# Patient Record
Sex: Female | Born: 1995 | Race: Black or African American | Hispanic: No | Marital: Single | Smoking: Never smoker
Health system: Southern US, Community
[De-identification: ages and names within clinical notes are randomized; demographics above are authoritative.]

## PROBLEM LIST (undated history)

## (undated) DIAGNOSIS — J45909 Unspecified asthma, uncomplicated: Secondary | ICD-10-CM

## (undated) DIAGNOSIS — D649 Anemia, unspecified: Secondary | ICD-10-CM

## (undated) HISTORY — DX: Anemia, unspecified: D64.9

## (undated) HISTORY — DX: Unspecified asthma, uncomplicated: J45.909

---
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
## 2016-05-06 ENCOUNTER — Encounter (HOSPITAL_COMMUNITY): Payer: Self-pay | Admitting: *Deleted

## 2016-05-06 ENCOUNTER — Emergency Department (HOSPITAL_COMMUNITY)
Admission: EM | Admit: 2016-05-06 | Discharge: 2016-05-07 | Disposition: A | Discharge status: Home / Self Care | Payer: 59 | Attending: Emergency Medicine | Admitting: Emergency Medicine

## 2016-05-06 DIAGNOSIS — R0602 Shortness of breath: Secondary | ICD-10-CM

## 2016-05-06 DIAGNOSIS — Z79899 Other long term (current) drug therapy: Secondary | ICD-10-CM | POA: Insufficient documentation

## 2016-05-06 NOTE — ED Provider Notes (Signed)
[facility]-EMERGENCY DEPT Provider Note   CSN: [id number] Arrival date & time: 05/06/16  1946  By signing my name below, I, Melissa Burgess, attest that this documentation has been prepared under the direction and in the presence of [employer], PA-C. Electronically Signed: Linna Burgess, Scribe. 05/06/2016. 11:24 PM.  History   Chief Complaint Chief Complaint  Patient presents with  . Shortness of Breath    The history is provided by the patient. No language interpreter was used.     HPI Comments: Melissa Burgess is a 21 y.o. female with PMHx of asthma who presents to the Emergency Department complaining of persistent dyspnea on exertion beginning yesterday. Patient reports she has been short of breath with light ambulation and talking which is unusual for her. She reports associated chest tightness, upper chest pain, palpitations, and occasional tingling in her extremities and around her mouth when her palpitations are present. She states her palpitations are most significant upon waking. Patient notes her symptoms are minimally modified by PO intake. No excessive caffeine or OTC cold medication use. [facility] the same and had a CXR, EKG, D-dimer, thyroid test, and basic labs performed, all of which were normal. She denies fevers, cough, congestion, wheezing, or any other associated symptoms. No recent URI type symptoms.   History reviewed. No pertinent past medical history.  There are no active problems to display for this patient.   History reviewed. No pertinent surgical history.  OB History    No data available       Home Medications    Prior to Admission medications   Medication Sig Start Date End Date Taking? Authorizing Provider  BIOTIN PO Take 1 tablet by mouth daily.   Yes Historical Provider, MD  ibuprofen (ADVIL,MOTRIN) 200 MG tablet Take 400 mg by mouth every 6 (six) hours as needed for cramping.   Yes Historical Provider, MD      Family History No family history on file.  Social History Social History  Substance Use Topics  . Smoking status: Never Smoker  . Smokeless tobacco: Never Used  . Alcohol use No     Allergies   Patient has no known allergies.   Review of Systems Review of Systems  Constitutional: Negative for fever.  HENT: Negative for congestion, rhinorrhea and sore throat.   Eyes: Negative for redness.  Respiratory: Positive for chest tightness and shortness of breath. Negative for cough and wheezing.   Cardiovascular: Positive for chest pain and palpitations.  Gastrointestinal: Negative for abdominal pain, diarrhea, nausea and vomiting.  Genitourinary: Negative for dysuria.  Musculoskeletal: Negative for myalgias.  Skin: Negative for rash.  Neurological: Positive for numbness (tingling). Negative for headaches.   Physical Exam Updated Vital Signs BP 101/60 (BP Location: Right Arm)   Pulse 75   Temp 98 F (36.7 C) (Oral)   Resp 16   LMP 04/27/2016   SpO2 96%   Physical Exam  Constitutional: She is oriented to person, place, and time. She appears well-developed and well-nourished. No distress.  HENT:  Head: Normocephalic and atraumatic.  Mouth/Throat: Oropharynx is clear and moist.  Eyes: Conjunctivae and EOM are normal. Right eye exhibits no discharge. Left eye exhibits no discharge.  Neck: Normal range of motion. Neck supple. No tracheal deviation present.  Cardiovascular: Normal rate, regular rhythm and normal heart sounds.  Exam reveals no gallop and no friction rub.   No murmur heard. Pulmonary/Chest: Effort normal and breath sounds normal. No respiratory distress.  Abdominal: Soft. There is no tenderness.  Musculoskeletal: Normal range of motion.  Neurological: She is alert and oriented to person, place, and time.  Skin: Skin is warm and dry.  Psychiatric: She has a normal mood and affect. Her behavior is normal.  Nursing note and vitals reviewed.  ED Treatments /  Results   EKG  EKG Interpretation  Date/Time:  Wednesday May 06 2016 23:40:38 EDT Ventricular Rate:  69 PR Interval:    QRS Duration: 94 QT Interval:  390 QTC Calculation: 418 R Axis:   81 Text Interpretation:  Sinus rhythm Normal ECG No previous ECGs available Confirmed by MOLPUS  MD, Jonny Ruiz ([id number]) on 05/06/2016 11:51:20 PM       Procedures Procedures (including critical care time)  DIAGNOSTIC STUDIES: Oxygen Saturation is 96% on RA, adequate by my interpretation.    COORDINATION OF CARE: 11:32 PM Discussed treatment plan with pt at bedside and pt agreed to plan.  Medications Ordered in ED Medications - No data to display   Initial Impression / Assessment and Plan / ED Course  I have reviewed the triage vital signs and the nursing notes.  Pertinent labs & imaging results that were available during my care of the patient were reviewed by me and considered in my medical decision making (see chart for details).     Vital signs reviewed and are as follows: Vitals:   05/06/16 1951 05/06/16 2310  BP: (!) 104/59 101/60  Pulse: 80 75  Resp: 20 16  Temp: 97.5 F (36.4 C) 98 F (36.7 C)   Patient was ambulated and maintained normal pulse ox and heart rate. Updated on EKG results.  Feel next step is for patient to visit with a cardiologist for consideration of Holter monitor and echocardiography.  Patient advised to return with any worsening symptoms including severe chest pain, difficulty breathing, syncope, new symptoms or other concerns. Discussed with mother at bedside. They're in agreement with plan. Discharged home with referral.  Final Clinical Impressions(s) / ED Diagnoses   Final diagnoses:  Shortness of breath   Patient with 2 days of shortness of breath and palpitations. Prior to arrival, patient had reportedly negative chest x-ray, d-dimer, general labs, thyroid test. Here EKG repeated and shows no signs of prolonged QTC, WPW, Brugada syndrome. EKG is  normal. She has ambulated without desaturation or tachycardia. At this point, do not feel that any further workup is required emergently. Patient and mother educated on signs and symptoms return and they seem reliable to return if any of these occur. Encouraged follow-up with cardiology to see a full-term monitor is indicated to rule out intermittent tachyarrhythmia such as SVT. Patient may try over-the-counter PPI as desired for empiric therapy.  New Prescriptions New Prescriptions   No medications on file   I personally performed the services described in this documentation, which was scribed in my presence. The recorded information has been reviewed and is accurate.    Renne Crigler, PA-C 05/07/16 6045    Paula Libra, MD 05/07/16 [phone]

## 2016-05-06 NOTE — ED Notes (Signed)
Patient ambulated to restroom and around ED with no difficulties. Pt's oxygen saturation never dropped below 96% on room air. Pt tolerated the ambulation well.

## 2016-05-06 NOTE — ED Triage Notes (Addendum)
Pt complains of shortness of breath since yesterday. Pt states she has a hx of asthma.  [facility], had d-dimer and chest x-ray, which were normal.

## 2016-05-07 NOTE — Discharge Instructions (Signed)
Please read and follow all provided instructions.  Your diagnoses today include:  1. Shortness of breath     Tests performed today include:  An EKG of your heart  Vital signs. See below for your results today.   Medications prescribed:   None  Take any prescribed medications only as directed.  Follow-up instructions: Please follow-up with your primary care provider as soon or the cardiologist listed as you can for further evaluation of your symptoms.   Return instructions:  SEEK IMMEDIATE MEDICAL ATTENTION IF:  You have severe chest pain, especially if the pain is crushing or pressure-like and spreads to the arms, back, neck, or jaw, or if you have sweating, nausea (feeling sick to your stomach), or shortness of breath. THIS IS AN EMERGENCY. Don't wait to see if the pain will go away. Get medical help at once. Call 911 or 0 (operator). DO NOT drive yourself to the hospital.   Your chest pain gets worse and does not go away with rest.   You have an attack of chest pain lasting longer than usual, despite rest and treatment with the medications your caregiver has prescribed.   You wake from sleep with chest pain or severe shortness of breath.  You pass out or faint.  You have chest pain not typical of your usual pain for which you originally saw your caregiver.   You have any other emergent concerns regarding your health.  Additional Information: Chest pain comes from many different causes. Your caregiver has diagnosed you as having chest pain that is not specific for one problem, but does not require admission.  You are at low risk for an acute heart condition or other serious illness.   Your vital signs today were: BP 101/60 (BP Location: Right Arm)    Pulse 75    Temp 98 F (36.7 C) (Oral)    Resp 16    LMP 04/27/2016    SpO2 96%  If your blood pressure (BP) was elevated above 135/85 this visit, please have this repeated by your doctor within one month. --------------

## 2017-04-22 ENCOUNTER — Ambulatory Visit: Payer: Self-pay | Admitting: Family Medicine

## 2017-04-26 ENCOUNTER — Other Ambulatory Visit: Payer: Self-pay

## 2017-04-26 ENCOUNTER — Ambulatory Visit (INDEPENDENT_AMBULATORY_CARE_PROVIDER_SITE_OTHER): Payer: 59 | Admitting: Family Medicine

## 2017-04-26 ENCOUNTER — Encounter: Payer: Self-pay | Admitting: Family Medicine

## 2017-04-26 VITALS — BP 100/56 | HR 92 | Temp 99.1°F | Resp 16 | Ht 62.5 in | Wt 122.4 lb

## 2017-04-26 DIAGNOSIS — E639 Nutritional deficiency, unspecified: Secondary | ICD-10-CM

## 2017-04-26 DIAGNOSIS — J4520 Mild intermittent asthma, uncomplicated: Secondary | ICD-10-CM

## 2017-04-26 DIAGNOSIS — Z713 Dietary counseling and surveillance: Secondary | ICD-10-CM

## 2017-04-26 DIAGNOSIS — Z7689 Persons encountering health services in other specified circumstances: Secondary | ICD-10-CM | POA: Diagnosis not present

## 2017-04-26 NOTE — Progress Notes (Signed)
Chief Complaint  Patient presents with  . New pt  . better health    would like to discuss eating healthy    HPI  Poor diet She a very unhealthy eater and eats junk food and fried foods She will be working 36 hours a week and going to school  She eats breakfast 8:30am and maybe lunch and dinner at 7:30pm She usually has mac and cheese for dinner, chicken, hawaiian rolls and water For breakfast she has chicken minis from [employer]  Asthma She reports that she has asthma that is mild States that she has not had a recent exacerbation No wheezing, cough, shortness of breath She reports that stress seems to aggravate her asthma   Past Medical History:  Diagnosis Date  . Anemia   . Asthma     Current Outpatient Medications  Medication Sig Dispense Refill  . BIOTIN PO Take 1 tablet by mouth daily.    Marland Kitchen ibuprofen (ADVIL,MOTRIN) 200 MG tablet Take 400 mg by mouth every 6 (six) hours as needed for cramping.    . Loratadine-Pseudoephedrine (CLARITIN-D 12 HOUR PO) Take by mouth.    . medroxyPROGESTERone (DEPO-PROVERA) 150 MG/ML injection Inject 150 mg into the muscle every 3 (three) months.     No current facility-administered medications for this visit.     Allergies: No Known Allergies  No past surgical history on file.  Social History   Socioeconomic History  . Marital status: Single    Spouse name: Not on file  . Number of children: Not on file  . Years of education: Not on file  . Highest education level: Not on file  Occupational History  . Not on file  Social Needs  . Financial resource strain: Not on file  . Food insecurity:    Worry: Not on file    Inability: Not on file  . Transportation needs:    Medical: Not on file    Non-medical: Not on file  Tobacco Use  . Smoking status: Never Smoker  . Smokeless tobacco: Never Used  Substance and Sexual Activity  . Alcohol use: No  . Drug use: Never  . Sexual activity: Not on file  Lifestyle  . Physical  activity:    Days per week: Not on file    Minutes per session: Not on file  . Stress: Not on file  Relationships  . Social connections:    Talks on phone: Not on file    Gets together: Not on file    Attends religious service: Not on file    Active member of club or organization: Not on file    Attends meetings of clubs or organizations: Not on file    Relationship status: Not on file  Other Topics Concern  . Not on file  Social History Narrative  . Not on file    Family History  Problem Relation Age of Onset  . Diabetes Maternal Grandmother   . Heart disease Maternal Grandmother   . Hyperlipidemia Maternal Grandmother   . Hypertension Maternal Grandmother   . Diabetes Paternal Grandmother   . Hypertension Paternal Grandmother      ROS Review of Systems See HPI Constitution: No fevers or chills No malaise No diaphoresis Skin: No rash or itching Eyes: no blurry vision, no double vision GU: no dysuria or hematuria Neuro: no dizziness or headaches all others reviewed and negative   Objective: Vitals:   04/26/17 1336  BP: (!) 100/56  Pulse: 92  Resp:  16  Temp: 99.1 F (37.3 C)  TempSrc: Oral  SpO2: 99%  Weight: 122 lb 6.4 oz (55.5 kg)  Height: 5' 2.5" (1.588 m)    Physical Exam Physical Exam  Constitutional: She is oriented to person, place, and time. She appears well-developed and well-nourished.  HENT:  Head: Normocephalic and atraumatic.  Eyes: Conjunctivae and EOM are normal.  Cardiovascular: Normal rate, regular rhythm and normal heart sounds.   Pulmonary/Chest: Effort normal and breath sounds normal. No respiratory distress. She has no wheezes.  Abdominal: Normal appearance and bowel sounds are normal. There is no tenderness. There is no CVA tenderness.  Neurological: She is alert and oriented to person, place, and time.    Assessment and Plan Johnella was seen today for new pt and better health.  Diagnoses and all orders for this visit:  Mild  intermittent asthma without complication- well controlled currently  Establishing care with new doctor, encounter for  Encounter for nutritional counseling- discussed fruits and vegetables Discussed meal prep Healthy substitutions when eating out at restaurants [email or web address].info/ advised for more information  As a [facility] employee Active[employer] app  Poor diet- discussed eating enough fresh fruits and vegetables   A total of 20 minutes were spent face-to-face with the patient during this encounter and over half of that time was spent on nutritional counseling.   [place]

## 2017-04-26 NOTE — Patient Instructions (Addendum)
Check out [email or web address]    IF you received an x-ray today, you will receive an invoice from [facility]. Please contact [facility] at [phone] with questions or concerns regarding your invoice.   IF you received labwork today, you will receive an invoice from [facility]. Please contact [facility] at [phone] with questions or concerns regarding your invoice.   Our billing staff will not be able to assist you with questions regarding bills from these companies.  You will be contacted with the lab results as soon as they are available. The fastest way to get your results is to activate your My Chart account. Instructions are located on the last page of this paperwork. If you have not heard from [place] regarding the results in 2 weeks, please contact this office.     Tips for Eating Away From Home If You Have Diabetes Controlling your level of blood glucose, also known as blood sugar, can be challenging. It can be even more difficult when you do not prepare your own meals. The following tips can help you manage your diabetes when you eat away from home. Planning ahead Plan ahead if you know you will be eating away from home:  Ask your health care provider how to time meals and medicine if you are taking insulin.  Make a list of restaurants near you that offer healthy choices. If they have a carry-out menu, take it home and plan what you will order ahead of time.  Look up the restaurant you want to eat at online. Many chain and fast-food restaurants list nutritional information online. Use this information to choose the healthiest options and to calculate how many carbohydrates will be in your meal.  Use a carbohydrate-counting book or mobile app to look up the carbohydrate content and serving size of the foods you want to eat.  Become familiar with serving sizes and learn to recognize how many servings are in a portion. This will allow you to estimate how many  carbohydrates you can eat.  Free foods A "free food" is any food or drink that has less than 5 g of carbohydrates per serving. Free foods include:  Many vegetables.  Hard boiled eggs.  Nuts or seeds.  Olives.  Cheeses.  Meats.  These types of foods make good appetizer choices and are often available at salad bars. Lemon juice, vinegar, or a low-calorie salad dressing of fewer than 20 calories per serving can be used as a "free" salad dressing. Choices to reduce carbohydrates  Substitute nonfat sweetened yogurt with a sugar-free yogurt. Yogurt made from soy milk may also be used, but you will still want a sugar-free or plain option to choose a lower carbohydrate amount.  Ask your server to take away the bread basket or chips from your table.  Order fresh fruit. A salad bar often offers fresh fruit choices. Avoid canned fruit because it is usually packed in sugar or syrup.  Order a salad, and eat it without dressing. Or, create a "free" salad dressing.  Ask for substitutions. For example, instead of [place] fries, request an order of a vegetable such as salad, green beans, or broccoli. Other tips  If you take insulin, take the insulin once your food arrives to your table. This will ensure your insulin and food are timed correctly.  Ask your server about the portion size before your order, and ask for a take-out box if the portion has more servings than you should have. When your food comes, leave the  amount you should have on the plate, and put the rest in the take-out box.  Consider splitting an entree with someone and ordering a side salad. This information is not intended to replace advice given to you by your health care provider. Make sure you discuss any questions you have with your health care provider. Document Released: 01/12/2005 Document Revised: 06/20/2015 Document Reviewed: 04/11/2013 Elsevier Interactive Patient Education  [employer]2018 [employer]

## 2018-07-05 ENCOUNTER — Encounter: Payer: Self-pay | Admitting: Family Medicine

## 2018-07-05 ENCOUNTER — Other Ambulatory Visit: Payer: Self-pay

## 2018-07-05 ENCOUNTER — Ambulatory Visit (INDEPENDENT_AMBULATORY_CARE_PROVIDER_SITE_OTHER): Payer: Managed Care, Other (non HMO) | Admitting: Family Medicine

## 2018-07-05 VITALS — BP 106/68 | HR 78 | Temp 98.5°F | Ht 62.0 in | Wt 121.8 lb

## 2018-07-05 DIAGNOSIS — J4520 Mild intermittent asthma, uncomplicated: Secondary | ICD-10-CM | POA: Diagnosis not present

## 2018-07-05 DIAGNOSIS — R5383 Other fatigue: Secondary | ICD-10-CM | POA: Diagnosis not present

## 2018-07-05 MED ORDER — MONTELUKAST SODIUM 10 MG PO TABS: 10.0000 mg | ORAL_TABLET | Freq: Every day | ORAL | 3 refills | Status: DC

## 2018-07-05 MED ORDER — ALBUTEROL SULFATE HFA 108 (90 BASE) MCG/ACT IN AERS: 2.0000 | INHALATION_SPRAY | Freq: Four times a day (QID) | RESPIRATORY_TRACT | 1 refills | Status: AC | PRN

## 2018-07-05 NOTE — Progress Notes (Signed)
Established Patient Office Visit  Subjective:  Patient ID: Melissa Burgess, female    DOB: 1995-04-15  Age: 23 y.o. MRN: [id number]  CC:  Chief Complaint  Patient presents with  . Weight Loss  . Fatigue    socail changes    HPI Melissa Burgess presents for   Asthma- mild intermittent She reports that during allergy season she had increasing wheezing and sob She started some montelukast which she takes as needed but ran out She also ran out of her rescue inhaler In the past two weeks she had symptoms 1-2 times per week She denies fevers or chills She denies exposure to anyone with covid without ppe She denies smoking  Triggers are stress or environmental allergies   Fatigue She reports that she just graduated and she has been feeling drained  She is not sleeping well and her sleep schedule is off She denies cold intolerance, denies heat intolerance She denies tremors or palpitations Patient's last menstrual period was 07/02/2018. She gets regular menses She exercises by going on walks Wt Readings from Last 3 Encounters:  07/05/18 121 lb 12.8 oz (55.2 kg)  04/26/17 122 lb 6.4 oz (55.5 kg)   Depression screen [facility] 2/9 07/05/2018 04/26/2017  Decreased Interest 0 0  Down, Depressed, Hopeless 0 0  PHQ - 2 Score 0 0     Past Medical History:  Diagnosis Date  . Anemia   . Asthma     No past surgical history on file.  Family History  Problem Relation Age of Onset  . Diabetes Maternal Grandmother   . Heart disease Maternal Grandmother   . Hyperlipidemia Maternal Grandmother   . Hypertension Maternal Grandmother   . Diabetes Paternal Grandmother   . Hypertension Paternal Grandmother     Social History   Socioeconomic History  . Marital status: Single    Spouse name: Not on file  . Number of children: Not on file  . Years of education: Not on file  . Highest education level: Not on file  Occupational History  . Not on file  Social Needs  . Financial resource  strain: Not on file  . Food insecurity:    Worry: Not on file    Inability: Not on file  . Transportation needs:    Medical: Not on file    Non-medical: Not on file  Tobacco Use  . Smoking status: Never Smoker  . Smokeless tobacco: Never Used  Substance and Sexual Activity  . Alcohol use: No  . Drug use: Never  . Sexual activity: Not on file  Lifestyle  . Physical activity:    Days per week: Not on file    Minutes per session: Not on file  . Stress: Not on file  Relationships  . Social connections:    Talks on phone: Not on file    Gets together: Not on file    Attends religious service: Not on file    Active member of club or organization: Not on file    Attends meetings of clubs or organizations: Not on file    Relationship status: Not on file  . Intimate partner violence:    Fear of current or ex partner: Not on file    Emotionally abused: Not on file    Physically abused: Not on file    Forced sexual activity: Not on file  Other Topics Concern  . Not on file  Social History Narrative  . Not on file    Outpatient Medications  Prior to Visit  Medication Sig Dispense Refill  . BIOTIN PO Take 1 tablet by mouth daily.    Marland Kitchen ibuprofen (ADVIL,MOTRIN) 200 MG tablet Take 400 mg by mouth every 6 (six) hours as needed for cramping.    . Loratadine-Pseudoephedrine (CLARITIN-D 12 HOUR PO) Take by mouth.    . medroxyPROGESTERone (DEPO-PROVERA) 150 MG/ML injection Inject 150 mg into the muscle every 3 (three) months.     No facility-administered medications prior to visit.     No Known Allergies  ROS Review of Systems Review of Systems  Constitutional: Negative for activity change, appetite change, chills and fever.  HENT: Negative for congestion, nosebleeds, trouble swallowing and voice change.   Respiratory: Negative for cough, shortness of breath and wheezing.   Gastrointestinal: Negative for diarrhea, nausea and vomiting.  Genitourinary: Negative for difficulty  urinating, dysuria, flank pain and hematuria.  Musculoskeletal: Negative for back pain, joint swelling and neck pain.  Neurological: Negative for dizziness, speech difficulty, light-headedness and numbness.  See HPI. All other review of systems negative.     Objective:    Physical Exam  BP 106/68 (BP Location: Right Arm, Patient Position: Sitting, Cuff Size: Normal)   Pulse 78   Temp 98.5 F (36.9 C) (Oral)   Ht 5' 2"  (1.575 m)   Wt 121 lb 12.8 oz (55.2 kg)   LMP 07/02/2018   SpO2 97%   BMI 22.28 kg/m  Wt Readings from Last 3 Encounters:  07/05/18 121 lb 12.8 oz (55.2 kg)  04/26/17 122 lb 6.4 oz (55.5 kg)   Physical Exam  Constitutional: Oriented to person, place, and time. Appears well-developed and well-nourished.  HENT:  Head: Normocephalic and atraumatic.  Eyes: Conjunctivae and EOM are normal.  Cardiovascular: Normal rate, regular rhythm, normal heart sounds and intact distal pulses.  No murmur heard. Pulmonary/Chest: Effort normal and breath sounds normal. No stridor. No respiratory distress. Has no wheezes.  Neurological: Is alert and oriented to person, place, and time.  Skin: Skin is warm. Capillary refill takes less than 2 seconds.  Psychiatric: Has a normal mood and affect. Behavior is normal. Judgment and thought content normal.    Health Maintenance Due  Topic Date Due  . HIV Screening  05/01/2011  . TETANUS/TDAP  05/01/2015  . PAP-Cervical Cytology Screening  04/30/2017  . PAP SMEAR-Modifier  04/30/2017    There are no preventive care reminders to display for this patient.  No results found for: TSH No results found for: WBC, HGB, HCT, MCV, PLT No results found for: NA, K, CHLORIDE, CO2, GLUCOSE, BUN, CREATININE, BILITOT, ALKPHOS, AST, ALT, PROT, ALBUMIN, CALCIUM, ANIONGAP, EGFR, GFR No results found for: CHOL No results found for: HDL No results found for: LDLCALC No results found for: TRIG No results found for: CHOLHDL No results found for:  HGBA1C    Assessment & Plan:   Problem List Items Addressed This Visit    None    Visit Diagnoses    Mild intermittent asthma without complication    -  Primary Stable currently, refilled asthma medications   Relevant Medications   montelukast (SINGULAIR) 10 MG tablet   albuterol (VENTOLIN HFA) 108 (90 Base) MCG/ACT inhaler   Other fatigue    -    Will screen for hormone changes and deficiencies Advised to take a multivitamin    Relevant Orders   CBC   TSH   VITAMIN D 25 Hydroxy (Vit-D Deficiency, Fractures)   CMP14+EGFR      Meds ordered this  encounter  Medications  . montelukast (SINGULAIR) 10 MG tablet    Sig: Take 1 tablet (10 mg total) by mouth at bedtime.    Dispense:  30 tablet    Refill:  3  . albuterol (VENTOLIN HFA) 108 (90 Base) MCG/ACT inhaler    Sig: Inhale 2 puffs into the lungs every 6 (six) hours as needed for wheezing or shortness of breath.    Dispense:  1 Inhaler    Refill:  1    A total of 25 minutes were spent face-to-face with the patient during this encounter and over half of that time was spent on counseling and coordination of care.  Follow-up: No follow-ups on file.    Forrest Moron, MD

## 2018-07-05 NOTE — Patient Instructions (Addendum)
If you have lab work done today you will be contacted with your lab results within the next 2 weeks.  If you have not heard from us then please contact us. The fastest way to get your results is to register for My Chart.   IF you received an x-ray today, you will receive an invoice from [facility]. Please contact [facility] at [phone] with questions or concerns regarding your invoice.   IF you received labwork today, you will receive an invoice from [facility]. Please contact [facility] at [phone] with questions or concerns regarding your invoice.   Our billing staff will not be able to assist you with questions regarding bills from these companies.  You will be contacted with the lab results as soon as they are available. The fastest way to get your results is to activate your My Chart account. Instructions are located on the last page of this paperwork. If you have not heard from us regarding the results in 2 weeks, please contact this office.      Asthma, Adult  Asthma is a long-term (chronic) condition that causes recurrent episodes in which the airways become tight and narrow. The airways are the passages that lead from the nose and mouth down into the lungs. Asthma episodes, also called asthma attacks, can cause coughing, wheezing, shortness of breath, and chest pain. The airways can also fill with mucus. During an attack, it can be difficult to breathe. Asthma attacks can range from minor to life threatening. Asthma cannot be cured, but medicines and lifestyle changes can help control it and treat acute attacks. What are the causes? This condition is believed to be caused by inherited (genetic) and environmental factors, but its exact cause is not known. There are many things that can bring on an asthma attack or make asthma symptoms worse (triggers). Asthma triggers are different for each person. Common triggers include:  Mold.  Dust.  Cigarette  smoke.  Cockroaches.  Things that can cause allergy symptoms (allergens), such as animal dander or pollen from trees or grass.  Air pollutants such as household cleaners, wood smoke, smog, or Therapist, occupationalchemical odors.  Cold air, weather changes, and winds (which increase molds and pollen in the air).  Strong emotional expressions such as crying or laughing hard.  Stress.  Certain medicines (such as aspirin) or types of medicines (such as beta-blockers).  Sulfites in foods and drinks. Foods and drinks that may contain sulfites include dried fruit, potato chips, and sparkling grape juice.  Infections or inflammatory conditions such as the flu, a cold, or inflammation of the nasal membranes (rhinitis).  Gastroesophageal reflux disease (GERD).  Exercise or strenuous activity. What are the signs or symptoms? Symptoms of this condition may occur right after asthma is triggered or many hours later. Symptoms include:  Wheezing. This can sound like whistling when you breathe.  Excessive nighttime or early morning coughing.  Frequent or severe coughing with a common cold.  Chest tightness.  Shortness of breath.  Tiredness (fatigue) with minimal activity. How is this diagnosed? This condition is diagnosed based on:  Your medical history.  A physical exam.  Tests, which may include: ? Lung function studies and pulmonary studies (spirometry). These tests can evaluate the flow of air in your lungs. ? Allergy tests. ? Imaging tests, such as X-rays. How is this treated? There is no cure for this condition, but treatment can help control your symptoms. Treatment for asthma usually involves:  Identifying and avoiding  your asthma triggers.  Using medicines to control your symptoms. Generally, two types of medicines are used to treat asthma: ? Controller medicines. These help prevent asthma symptoms from occurring. They are usually taken every day. ? Fast-acting reliever or rescue medicines.  These quickly relieve asthma symptoms by widening the narrow and tight airways. They are used as needed and provide short-term relief.  Using supplemental oxygen. This may be needed during a severe episode.  Using other medicines, such as: ? Allergy medicines, such as antihistamines, if your asthma attacks are triggered by allergens. ? Immune medicines (immunomodulators). These are medicines that help control the immune system.  Creating an asthma action plan. An asthma action plan is a written plan for managing and treating your asthma attacks. This plan includes: ? A list of your asthma triggers and how to avoid them. ? Information about when medicines should be taken and when their dosage should be changed. ? Instructions about using a device called a peak flow meter. A peak flow meter measures how well the lungs are working and the severity of your asthma. It helps you monitor your condition. Follow these instructions at home: Controlling your home environment Control your home environment in the following ways to help avoid triggers and prevent asthma attacks:  Change your heating and air conditioning filter regularly.  Limit your use of fireplaces and wood stoves.  Get rid of pests (such as roaches and mice) and their droppings.  Throw away plants if you see mold on them.  Clean floors and dust surfaces regularly. Use unscented cleaning products.  Try to have someone else vacuum for you regularly. Stay out of rooms while they are being vacuumed and for a short while afterward. If you vacuum, use a dust mask from a hardware store, a double-layered or microfilter vacuum cleaner bag, or a vacuum cleaner with a HEPA filter.  Replace carpet with wood, tile, or vinyl flooring. Carpet can trap dander and dust.  Use allergy-proof pillows, mattress covers, and box spring covers.  Keep your bedroom a trigger-free room.  Avoid pets and keep windows closed when allergens are in the  air.  Wash beddings every week in hot water and dry them in a dryer.  Use blankets that are made of polyester or cotton.  Clean bathrooms and kitchens with bleach. If possible, have someone repaint the walls in these rooms with mold-resistant paint. Stay out of the rooms that are being cleaned and painted.  Wash your hands often with soap and water. If soap and water are not available, use hand sanitizer.  Do not allow anyone to smoke in your home. General instructions  Take over-the-counter and prescription medicines only as told by your health care provider. ? Speak with your health care provider if you have questions about how or when to take the medicines. ? Make note if you are requiring more frequent dosages.  Do not use any products that contain nicotine or tobacco, such as cigarettes and e-cigarettes. If you need help quitting, ask your health care provider. Also, avoid being exposed to secondhand smoke.  Use a peak flow meter as told by your health care provider. Record and keep track of the readings.  Understand and use the asthma action plan to help minimize, or stop an asthma attack, without needing to seek medical care.  Make sure you stay up to date on your yearly vaccinations as told by your health care provider. This may include vaccines for the flu and pneumonia.  Avoid outdoor activities when allergen counts are high and when air quality is low.  Wear a ski mask that covers your nose and mouth during outdoor winter activities. Exercise indoors on cold days if you can.  Warm up before exercising, and take time for a cool-down period after exercise.  Keep all follow-up visits as told by your health care provider. This is important. Where to find more information  For information about asthma, turn to the Centers for Disease Control and Prevention at http://www.mills-berg.com/www.cdc.gov/asthma/faqs.htm  For air quality information, turn to [employer]Now at [employer].nohttps://airnow.gov/ Contact a health  care provider if:  You have wheezing, shortness of breath, or a cough even while you are taking medicine to prevent attacks.  The mucus you cough up (sputum) is thicker than usual.  Your sputum changes from clear or white to yellow, green, gray, or bloody.  Your medicines are causing side effects, such as a rash, itching, swelling, or trouble breathing.  You need to use a reliever medicine more than 2-3 times a week.  Your peak flow reading is still at 50-79% of your personal best after following your action plan for 1 hour.  You have a fever. Get help right away if:  You are getting worse and do not respond to treatment during an asthma attack.  You are short of breath when at rest or when doing very little physical activity.  You have difficulty eating, drinking, or talking.  You have chest pain or tightness.  You develop a fast heartbeat or palpitations.  You have a bluish color to your lips or fingernails.  You are light-headed or dizzy, or you faint.  Your peak flow reading is less than 50% of your personal best.  You feel too tired to breathe normally. Summary  Asthma is a long-term (chronic) condition that causes recurrent episodes in which the airways become tight and narrow. These episodes can cause coughing, wheezing, shortness of breath, and chest pain.  Asthma cannot be cured, but medicines and lifestyle changes can help control it and treat acute attacks.  Make sure you understand how to avoid triggers and how and when to use your medicines.  Asthma attacks can range from minor to life threatening. Get help right away if you have an asthma attack and do not respond to treatment with your usual rescue medicines. This information is not intended to replace advice given to you by your health care provider. Make sure you discuss any questions you have with your health care provider. Document Released: 01/12/2005 Document Revised: 02/17/2016 Document Reviewed:  02/17/2016 Elsevier Interactive Patient Education  2019 [employer]

## 2018-07-06 LAB — CBC
Hematocrit: 36.1 % (ref 34.0–46.6)
Hemoglobin: 11.3 g/dL (ref 11.1–15.9)
MCH: 23.7 pg — ABNORMAL LOW (ref 26.6–33.0)
MCHC: 31.3 g/dL — ABNORMAL LOW (ref 31.5–35.7)
MCV: 76 fL — ABNORMAL LOW (ref 79–97)
Platelets: 334 10*3/uL (ref 150–450)
RBC: 4.76 x10E6/uL (ref 3.77–5.28)
RDW: 13.1 % (ref 11.7–15.4)
WBC: 3.8 10*3/uL (ref 3.4–10.8)

## 2018-07-06 LAB — CMP14+EGFR
ALT: 9 IU/L (ref 0–32)
AST: 14 IU/L (ref 0–40)
Albumin/Globulin Ratio: 1.6 (ref 1.2–2.2)
Albumin: 4.4 g/dL (ref 3.9–5.0)
Alkaline Phosphatase: 52 IU/L (ref 39–117)
BUN/Creatinine Ratio: 9 (ref 9–23)
BUN: 8 mg/dL (ref 6–20)
Bilirubin Total: 0.3 mg/dL (ref 0.0–1.2)
CO2: 21 mmol/L (ref 20–29)
Calcium: 9.6 mg/dL (ref 8.7–10.2)
Chloride: 102 mmol/L (ref 96–106)
Creatinine, Ser: 0.86 mg/dL (ref 0.57–1.00)
GFR calc Af Amer: 111 mL/min/{1.73_m2} (ref >59)
GFR calc non Af Amer: 96 mL/min/{1.73_m2} (ref >59)
Globulin, Total: 2.8 g/dL (ref 1.5–4.5)
Glucose: 88 mg/dL (ref 65–99)
Potassium: 3.8 mmol/L (ref 3.5–5.2)
Sodium: 142 mmol/L (ref 134–144)
Total Protein: 7.2 g/dL (ref 6.0–8.5)

## 2018-07-06 LAB — TSH: TSH: 0.81 u[IU]/mL (ref 0.450–4.500)

## 2018-07-06 LAB — VITAMIN D 25 HYDROXY (VIT D DEFICIENCY, FRACTURES): Vit D, 25-Hydroxy: 11.3 ng/mL — ABNORMAL LOW (ref 30.0–100.0)

## 2018-07-14 ENCOUNTER — Other Ambulatory Visit: Payer: Self-pay

## 2018-07-14 ENCOUNTER — Telehealth: Payer: Self-pay | Admitting: Family Medicine

## 2018-07-14 NOTE — Telephone Encounter (Signed)
Copied from [place] [id number]. Topic: General - Other >> Jul 14, 2018 11:24 AM Percell Belt A wrote: Reason for CRM: pt called in and stated that her labs came back and her Vit D is low and Dr Nolon Rod was going to call in Vit D for her.  She would like to know when that would be call in?    Pharmacy - [facility] on [address] number [phone]

## 2018-07-15 MED ORDER — VITAMIN D (ERGOCALCIFEROL) 1.25 MG (50000 UNIT) PO CAPS: 50000.0000 [IU] | ORAL_CAPSULE | ORAL | 1 refills | Status: DC

## 2018-07-15 NOTE — Telephone Encounter (Signed)
Vit d has been sent into pharmacy provider review and called pt and informed her via VM. 6 m f/u is recommended to check levels again.

## 2018-07-27 ENCOUNTER — Encounter: Payer: Self-pay | Admitting: Family Medicine

## 2018-09-12 ENCOUNTER — Encounter: Payer: Self-pay | Admitting: Family Medicine

## 2018-10-11 ENCOUNTER — Ambulatory Visit: Payer: Self-pay | Admitting: Family Medicine

## 2018-10-25 ENCOUNTER — Telehealth: Payer: Self-pay | Admitting: Family Medicine

## 2018-10-25 NOTE — Telephone Encounter (Signed)
Pt would like to come in 10/6-10/7 for lab work prior to her 10/9 appt. Lab orders not in. Please advise when orders are in to schedule nurse visit

## 2018-10-26 ENCOUNTER — Other Ambulatory Visit: Payer: Self-pay

## 2018-10-26 DIAGNOSIS — D500 Iron deficiency anemia secondary to blood loss (chronic): Secondary | ICD-10-CM

## 2018-10-26 DIAGNOSIS — E559 Vitamin D deficiency, unspecified: Secondary | ICD-10-CM

## 2018-10-26 NOTE — Telephone Encounter (Signed)
Spoke with pt advised future labs entered and pt would like to come in on 10/31/2018 at 8:00 am.  Pt will be fasting.  Pt scheduled for 10/31/2018 at 8:40 am.  Pt agreeable.

## 2018-10-31 ENCOUNTER — Ambulatory Visit (INDEPENDENT_AMBULATORY_CARE_PROVIDER_SITE_OTHER): Payer: Managed Care, Other (non HMO) | Admitting: Family Medicine

## 2018-10-31 ENCOUNTER — Other Ambulatory Visit: Payer: Self-pay

## 2018-10-31 DIAGNOSIS — E559 Vitamin D deficiency, unspecified: Secondary | ICD-10-CM

## 2018-10-31 DIAGNOSIS — D500 Iron deficiency anemia secondary to blood loss (chronic): Secondary | ICD-10-CM

## 2018-11-01 ENCOUNTER — Other Ambulatory Visit: Payer: Self-pay | Admitting: Family Medicine

## 2018-11-01 LAB — CBC
Hematocrit: 35.7 % (ref 34.0–46.6)
Hemoglobin: 11.3 g/dL (ref 11.1–15.9)
MCH: 24.1 pg — ABNORMAL LOW (ref 26.6–33.0)
MCHC: 31.7 g/dL (ref 31.5–35.7)
MCV: 76 fL — ABNORMAL LOW (ref 79–97)
Platelets: 323 10*3/uL (ref 150–450)
RBC: 4.69 x10E6/uL (ref 3.77–5.28)
RDW: 13.5 % (ref 11.7–15.4)
WBC: 3.8 10*3/uL (ref 3.4–10.8)

## 2018-11-01 LAB — VITAMIN D 25 HYDROXY (VIT D DEFICIENCY, FRACTURES): Vit D, 25-Hydroxy: 60.9 ng/mL (ref 30.0–100.0)

## 2018-11-01 MED ORDER — FERROUS SULFATE 325 (65 FE) MG PO TBEC: 325.0000 mg | DELAYED_RELEASE_TABLET | Freq: Three times a day (TID) | ORAL | 3 refills | Status: AC

## 2018-11-04 ENCOUNTER — Ambulatory Visit (INDEPENDENT_AMBULATORY_CARE_PROVIDER_SITE_OTHER): Payer: Managed Care, Other (non HMO) | Admitting: Family Medicine

## 2018-11-04 ENCOUNTER — Other Ambulatory Visit: Payer: Self-pay

## 2018-11-04 ENCOUNTER — Encounter: Payer: Self-pay | Admitting: Family Medicine

## 2018-11-04 VITALS — BP 102/58 | HR 89 | Temp 98.4°F | Resp 16 | Ht 62.0 in | Wt 119.0 lb

## 2018-11-04 DIAGNOSIS — R5383 Other fatigue: Secondary | ICD-10-CM | POA: Diagnosis not present

## 2018-11-04 DIAGNOSIS — D500 Iron deficiency anemia secondary to blood loss (chronic): Secondary | ICD-10-CM | POA: Diagnosis not present

## 2018-11-04 DIAGNOSIS — E559 Vitamin D deficiency, unspecified: Secondary | ICD-10-CM

## 2018-11-04 NOTE — Progress Notes (Signed)
Established Patient Office Visit  Subjective:  Patient ID: Melissa Burgess, female    DOB: 10/28/1995  Age: 23 y.o. MRN: [id number]  CC:  Chief Complaint  Patient presents with  . review lab work    following up on her recent blood work    HPI Melissa Burgess presents for   Vitamin D Patient is here to follow up from her visit in June 2020 for weight loss and fatigue She was found to have mild anemia and vitamin D deficiency She was started on vitamin D supplements and advised to take a daily iron  Component     Latest Ref Rng & Units 07/05/2018 10/31/2018  Vitamin D, 25-Hydroxy     30.0 - 100.0 ng/mL 11.3 (L) 60.9   Anemia She is currently taking She reports tha there energy is a lot better She reports that her fatigue has improved  Her periods are moderate.  Her diet consists of greens and healthy fruits and vegetables  She is now taking iron twice a day   Component     Latest Ref Rng & Units 07/05/2018 10/31/2018  WBC     3.4 - 10.8 x10E3/uL 3.8 3.8  RBC     3.77 - 5.28 x10E6/uL 4.76 4.69  Hemoglobin     11.1 - 15.9 g/dL 40.911.3 81.111.3  HCT     91.434.0 - 46.6 % 36.1 35.7  MCV     79 - 97 fL 76 (L) 76 (L)  MCH     26.6 - 33.0 pg 23.7 (L) 24.1 (L)  MCHC     31.5 - 35.7 g/dL 78.231.3 (L) 95.631.7  RDW     11.7 - 15.4 % 13.1 13.5  Platelets     150 - 450 x10E3/uL 334 323   Weight loss She is eating a very healthy diet and reports that she is exercising She is building muscle and feels healthier  Wt Readings from Last 3 Encounters:  11/04/18 119 lb (54 kg)  07/05/18 121 lb 12.8 oz (55.2 kg)  04/26/17 122 lb 6.4 oz (55.5 kg)     Past Medical History:  Diagnosis Date  . Anemia   . Asthma     History reviewed. No pertinent surgical history.  Family History  Problem Relation Age of Onset  . Diabetes Maternal Grandmother   . Heart disease Maternal Grandmother   . Hyperlipidemia Maternal Grandmother   . Hypertension Maternal Grandmother   . Diabetes Paternal Grandmother    . Hypertension Paternal Grandmother     Social History   Socioeconomic History  . Marital status: Single    Spouse name: Not on file  . Number of children: Not on file  . Years of education: Not on file  . Highest education level: Not on file  Occupational History  . Not on file  Social Needs  . Financial resource strain: Not on file  . Food insecurity    Worry: Not on file    Inability: Not on file  . Transportation needs    Medical: Not on file    Non-medical: Not on file  Tobacco Use  . Smoking status: Never Smoker  . Smokeless tobacco: Never Used  Substance and Sexual Activity  . Alcohol use: No  . Drug use: Never  . Sexual activity: Not on file  Lifestyle  . Physical activity    Days per week: Not on file    Minutes per session: Not on file  . Stress: Not on file  Relationships  . Social Herbalist on phone: Not on file    Gets together: Not on file    Attends religious service: Not on file    Active member of club or organization: Not on file    Attends meetings of clubs or organizations: Not on file    Relationship status: Not on file  . Intimate partner violence    Fear of current or ex partner: Not on file    Emotionally abused: Not on file    Physically abused: Not on file    Forced sexual activity: Not on file  Other Topics Concern  . Not on file  Social History Narrative  . Not on file    Outpatient Medications Prior to Visit  Medication Sig Dispense Refill  . albuterol (VENTOLIN HFA) 108 (90 Base) MCG/ACT inhaler Inhale 2 puffs into the lungs every 6 (six) hours as needed for wheezing or shortness of breath. 1 Inhaler 1  . ferrous sulfate 325 (65 FE) MG EC tablet Take 1 tablet (325 mg total) by mouth 3 (three) times daily with meals. 90 tablet 3  . montelukast (SINGULAIR) 10 MG tablet Take 1 tablet (10 mg total) by mouth at bedtime. 30 tablet 3  . Vitamin D, Ergocalciferol, (DRISDOL) 1.25 MG (50000 UT) CAPS capsule Take 1 capsule  (50,000 Units total) by mouth every 7 (seven) days. 12 capsule 1   No facility-administered medications prior to visit.     No Known Allergies  ROS Review of Systems Review of Systems  Constitutional: Negative for activity change, appetite change, chills and fever.  HENT: Negative for congestion, nosebleeds, trouble swallowing and voice change.   Respiratory: Negative for cough, shortness of breath and wheezing.   Gastrointestinal: Negative for diarrhea, nausea and vomiting.  Genitourinary: Negative for difficulty urinating, dysuria, flank pain and hematuria.  Musculoskeletal: Negative for back pain, joint swelling and neck pain.  Neurological: Negative for dizziness, speech difficulty, light-headedness and numbness.  See HPI. All other review of systems negative.     Objective:    Physical Exam  BP (!) 102/58   Pulse 89   Temp 98.4 F (36.9 C) (Oral)   Resp 16   Ht 5\' 2"  (1.575 m)   Wt 119 lb (54 kg)   SpO2 97%   BMI 21.77 kg/m  Wt Readings from Last 3 Encounters:  11/04/18 119 lb (54 kg)  07/05/18 121 lb 12.8 oz (55.2 kg)  04/26/17 122 lb 6.4 oz (55.5 kg)   Physical Exam  Constitutional: Oriented to person, place, and time. Appears well-developed and well-nourished.  HENT:  Head: Normocephalic and atraumatic.  Eyes: Conjunctivae and EOM are normal.  Cardiovascular: Normal rate, regular rhythm, normal heart sounds and intact distal pulses.  No murmur heard. Pulmonary/Chest: Effort normal and breath sounds normal. No stridor. No respiratory distress. Has no wheezes.  Neurological: Is alert and oriented to person, place, and time.  Skin: Skin is warm. Capillary refill takes less than 2 seconds.  Psychiatric: Has a normal mood and affect. Behavior is normal. Judgment and thought content normal.    Health Maintenance Due  Topic Date Due  . HIV Screening  05/01/2011  . TETANUS/TDAP  05/01/2015  . PAP-Cervical Cytology Screening  04/30/2017  . PAP SMEAR-Modifier   04/30/2017    There are no preventive care reminders to display for this patient.  Lab Results  Component Value Date   TSH 0.810 07/05/2018   Lab Results  Component Value Date  WBC 3.8 10/31/2018   HGB 11.3 10/31/2018   HCT 35.7 10/31/2018   MCV 76 (L) 10/31/2018   PLT 323 10/31/2018   Lab Results  Component Value Date   NA 142 07/05/2018   K 3.8 07/05/2018   CO2 21 07/05/2018   GLUCOSE 88 07/05/2018   BUN 8 07/05/2018   CREATININE 0.86 07/05/2018   BILITOT 0.3 07/05/2018   ALKPHOS 52 07/05/2018   AST 14 07/05/2018   ALT 9 07/05/2018   PROT 7.2 07/05/2018   ALBUMIN 4.4 07/05/2018   CALCIUM 9.6 07/05/2018   No results found for: CHOL No results found for: HDL No results found for: LDLCALC No results found for: TRIG No results found for: CHOLHDL No results found for: TDVV6H    Assessment & Plan:   Problem List Items Addressed This Visit    None    Visit Diagnoses    Vitamin D deficiency    -  Primary   Iron deficiency anemia due to chronic blood loss       Other fatigue         Fatigue:  Improved with vitamin D supplementation Anemia: unchanged so iron was increased to bid which she is tolerating Vitamin D deficiency: improved   No orders of the defined types were placed in this encounter.   Follow-up: No follow-ups on file.    Doristine Bosworth, MD

## 2018-11-04 NOTE — Patient Instructions (Signed)
° ° ° °  If you have lab work done today you will be contacted with your lab results within the next 2 weeks.  If you have not heard from us then please contact us. The fastest way to get your results is to register for My Chart. ° ° °IF you received an x-ray today, you will receive an invoice from [facility]. Please contact [facility] at [phone] with questions or concerns regarding your invoice.  ° °IF you received labwork today, you will receive an invoice from [facility]. Please contact [facility] at [phone] with questions or concerns regarding your invoice.  ° °Our billing staff will not be able to assist you with questions regarding bills from these companies. ° °You will be contacted with the lab results as soon as they are available. The fastest way to get your results is to activate your My Chart account. Instructions are located on the last page of this paperwork. If you have not heard from us regarding the results in 2 weeks, please contact this office. °  ° ° ° °

## 2018-11-11 ENCOUNTER — Other Ambulatory Visit: Payer: Self-pay

## 2018-11-11 DIAGNOSIS — Z20822 Contact with and (suspected) exposure to covid-19: Secondary | ICD-10-CM

## 2018-11-13 LAB — NOVEL CORONAVIRUS, NAA: SARS-CoV-2, NAA: NOT DETECTED

## 2018-11-15 ENCOUNTER — Other Ambulatory Visit: Payer: Self-pay

## 2018-11-15 DIAGNOSIS — Z20822 Contact with and (suspected) exposure to covid-19: Secondary | ICD-10-CM

## 2018-11-16 LAB — NOVEL CORONAVIRUS, NAA: SARS-CoV-2, NAA: NOT DETECTED

## 2018-11-30 ENCOUNTER — Telehealth (INDEPENDENT_AMBULATORY_CARE_PROVIDER_SITE_OTHER): Payer: Managed Care, Other (non HMO) | Admitting: Registered Nurse

## 2018-11-30 ENCOUNTER — Encounter: Payer: Self-pay | Admitting: Registered Nurse

## 2018-11-30 ENCOUNTER — Inpatient Hospital Stay
Admission: RE | Admit: 2018-11-30 | Discharge: 2018-11-30 | Disposition: A | Discharge status: Home / Self Care | Payer: Self-pay | Source: Ambulatory Visit

## 2018-11-30 ENCOUNTER — Other Ambulatory Visit: Payer: Self-pay

## 2018-11-30 VITALS — Temp 98.7°F | Ht 62.0 in | Wt 121.0 lb

## 2018-11-30 DIAGNOSIS — J039 Acute tonsillitis, unspecified: Secondary | ICD-10-CM | POA: Diagnosis not present

## 2018-11-30 MED ORDER — AZITHROMYCIN 250 MG PO TABS: ORAL_TABLET | ORAL | 0 refills | Status: DC

## 2018-11-30 NOTE — Patient Instructions (Signed)
° ° ° °  If you have lab work done today you will be contacted with your lab results within the next 2 weeks.  If you have not heard from us then please contact us. The fastest way to get your results is to register for My Chart. ° ° °IF you received an x-ray today, you will receive an invoice from [facility]. Please contact [facility] at [phone] with questions or concerns regarding your invoice.  ° °IF you received labwork today, you will receive an invoice from [facility]. Please contact [facility] at [phone] with questions or concerns regarding your invoice.  ° °Our billing staff will not be able to assist you with questions regarding bills from these companies. ° °You will be contacted with the lab results as soon as they are available. The fastest way to get your results is to activate your My Chart account. Instructions are located on the last page of this paperwork. If you have not heard from us regarding the results in 2 weeks, please contact this office. °  ° ° ° °

## 2018-11-30 NOTE — Progress Notes (Signed)
Soar Throat- since the first of this month. Little bit of flem. Really bad last night. Worse at night. swallow some golf ball feeling.

## 2018-11-30 NOTE — Progress Notes (Signed)
Telemedicine Encounter- SOAP NOTE Established Patient  This telephone encounter was conducted with the patient's (or proxy's) verbal consent via audio telecommunications: yes  Patient was instructed to have this encounter in a suitably private space; and to only have persons present to whom they give permission to participate. In addition, patient identity was confirmed by use of name plus two identifiers (DOB and address).  I discussed the limitations, risks, security and privacy concerns of performing an evaluation and management service by telephone and the availability of in person appointments. I also discussed with the patient that there may be a patient responsible charge related to this service. The patient expressed understanding and agreed to proceed.  I spent a total of 11 minutes talking with the patient or their proxy.  No chief complaint on file.   Subjective   Melissa Burgess is a 23 y.o. established patient. Telephone visit today for throat pain  HPI Onset 4 days ago. Worse on L side. Worsening. Worst at night. Feels that there is swelling, globus sensation.  Denies GERD, NVD, headache, sinus pressure or pain, ear pressure or pain, changes to vision, taste, or smell, chest congestion, cough, shob, chest pain, fatigue, myalgias.   There are no active problems to display for this patient.   Past Medical History:  Diagnosis Date  . Anemia   . Asthma     Current Outpatient Medications  Medication Sig Dispense Refill  . albuterol (VENTOLIN HFA) 108 (90 Base) MCG/ACT inhaler Inhale 2 puffs into the lungs every 6 (six) hours as needed for wheezing or shortness of breath. 1 Inhaler 1  . ferrous sulfate 325 (65 FE) MG EC tablet Take 1 tablet (325 mg total) by mouth 3 (three) times daily with meals. 90 tablet 3  . azithromycin (ZITHROMAX) 250 MG tablet Take 2 tabs on first day. Then take one tab daily. Finish supply. 6 tablet 0   No current facility-administered medications  for this visit.     No Known Allergies  Social History   Socioeconomic History  . Marital status: Single    Spouse name: Not on file  . Number of children: Not on file  . Years of education: Not on file  . Highest education level: Not on file  Occupational History  . Not on file  Social Needs  . Financial resource strain: Not on file  . Food insecurity    Worry: Not on file    Inability: Not on file  . Transportation needs    Medical: Not on file    Non-medical: Not on file  Tobacco Use  . Smoking status: Never Smoker  . Smokeless tobacco: Never Used  Substance and Sexual Activity  . Alcohol use: No  . Drug use: Never  . Sexual activity: Not on file  Lifestyle  . Physical activity    Days per week: Not on file    Minutes per session: Not on file  . Stress: Not on file  Relationships  . Social Musician on phone: Not on file    Gets together: Not on file    Attends religious service: Not on file    Active member of club or organization: Not on file    Attends meetings of clubs or organizations: Not on file    Relationship status: Not on file  . Intimate partner violence    Fear of current or ex partner: Not on file    Emotionally abused: Not on file  Physically abused: Not on file    Forced sexual activity: Not on file  Other Topics Concern  . Not on file  Social History Narrative  . Not on file    ROS Per hpi   Objective   Vitals as reported by the patient: Today's Vitals   11/30/18 1207  Temp: 98.7 F (37.1 C)  TempSrc: Oral  Weight: 121 lb (54.9 kg)  Height: 5\' 2"  (1.575 m)    Diagnoses and all orders for this visit:  Acute tonsillitis, unspecified etiology -     azithromycin (ZITHROMAX) 250 MG tablet; Take 2 tabs on first day. Then take one tab daily. Finish supply.   PLAN  Feel this is an acute bacterial pharyngitis/tonsillitis. Will treat with z pack  Given precautions regarding peritonsillar abscess given the patient's  unilateral pain and trouble swallowing  She will return to clinic if symptoms worsen or fail to improve  Patient encouraged to call clinic with any questions, comments, or concerns.    I discussed the assessment and treatment plan with the patient. The patient was provided an opportunity to ask questions and all were answered. The patient agreed with the plan and demonstrated an understanding of the instructions.   The patient was advised to call back or seek an in-person evaluation if the symptoms worsen or if the condition fails to improve as anticipated.  I provided 11 minutes of non-face-to-face time during this encounter.  Maximiano Coss, NP  Primary Care at [facility]

## 2018-12-02 ENCOUNTER — Telehealth: Payer: Self-pay | Admitting: Registered Nurse

## 2018-12-02 NOTE — Telephone Encounter (Signed)
Copied from [place] [phone] [phone]. Topic: General - Other >> Dec 02, 2018  3:03 PM Rainey Pines A wrote: Patient was advised to callback with status update since her last visit . Patient stated that she has 2 more days left of medication. Patient feels 10 times better and amazing. Patient stated that she will finish up antibiotics. Patient also stated that her covid test was negative.  Patient is also requesting a wqork note for 11/29/2018. Please advise.

## 2018-12-06 ENCOUNTER — Encounter: Payer: Self-pay | Admitting: Registered Nurse

## 2018-12-06 NOTE — Telephone Encounter (Signed)
Work note was created and sent to pt thru [employer], pt informed.

## 2018-12-12 ENCOUNTER — Encounter: Payer: Managed Care, Other (non HMO) | Admitting: Family Medicine

## 2018-12-15 ENCOUNTER — Other Ambulatory Visit: Payer: Self-pay

## 2018-12-15 DIAGNOSIS — Z20822 Contact with and (suspected) exposure to covid-19: Secondary | ICD-10-CM

## 2018-12-17 LAB — NOVEL CORONAVIRUS, NAA: SARS-CoV-2, NAA: NOT DETECTED

## 2018-12-28 ENCOUNTER — Other Ambulatory Visit: Payer: Self-pay

## 2018-12-28 ENCOUNTER — Ambulatory Visit (INDEPENDENT_AMBULATORY_CARE_PROVIDER_SITE_OTHER): Payer: Managed Care, Other (non HMO) | Admitting: Family Medicine

## 2018-12-28 VITALS — BP 103/63 | HR 72 | Temp 97.6°F | Ht 61.5 in | Wt 119.0 lb

## 2018-12-28 DIAGNOSIS — Z111 Encounter for screening for respiratory tuberculosis: Secondary | ICD-10-CM | POA: Diagnosis not present

## 2018-12-28 DIAGNOSIS — Z23 Encounter for immunization: Secondary | ICD-10-CM

## 2018-12-28 DIAGNOSIS — Z0000 Encounter for general adult medical examination without abnormal findings: Secondary | ICD-10-CM

## 2018-12-28 NOTE — Patient Instructions (Addendum)
   For stress management and counseling check out [employer] [facility] for a therapist near you.   If you have lab work done today you will be contacted with your lab results within the next 2 weeks.  If you have not heard from [place] then please contact us. The fastest way to get your results is to register for My Chart.   IF you received an x-ray today, you will receive an invoice from [facility]. Please contact [facility] at [phone] with questions or concerns regarding your invoice.   IF you received labwork today, you will receive an invoice from [place] [facility]. Please contact [facility] at [phone] with questions or concerns regarding your invoice.   Our billing staff will not be able to assist you with questions regarding bills from these companies.  You will be contacted with the lab results as soon as they are available. The fastest way to get your results is to activate your My Chart account. Instructions are located on the last page of this paperwork. If you have not heard from [place] regarding the results in 2 weeks, please contact this office.

## 2018-12-28 NOTE — Progress Notes (Signed)
Chief Complaint  Patient presents with  . Annual Exam    TB screening needed    Subjective:  Melissa Burgess is a 23 y.o. female here for a health maintenance visit.  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  She states that it will be 10.5 months. There are no active problems to display for this patient.   Past Medical History:  Diagnosis Date  . Anemia   . Asthma     No past surgical history on file.   Outpatient Medications Prior to Visit  Medication Sig Dispense Refill  . albuterol (VENTOLIN HFA) 108 (90 Base) MCG/ACT inhaler Inhale 2 puffs into the lungs every 6 (six) hours as needed for wheezing or shortness of breath. 1 Inhaler 1  . ferrous sulfate 325 (65 FE) MG EC tablet Take 1 tablet (325 mg total) by mouth 3 (three) times daily with meals. 90 tablet 3  . azithromycin (ZITHROMAX) 250 MG tablet Take 2 tabs on first day. Then take one tab daily. Finish supply. (Patient not taking: Reported on 12/28/2018) 6 tablet 0   No facility-administered medications prior to visit.     No Known Allergies   Family History  Problem Relation Age of Onset  . Diabetes Maternal Grandmother   . Heart disease Maternal Grandmother   . Hyperlipidemia Maternal Grandmother   . Hypertension Maternal Grandmother   . Diabetes Paternal Grandmother   . Hypertension Paternal Grandmother      Health Habits: Dental Exam: up to date Eye Exam: up to date Exercise: 7 times/week on average Current exercise activities: walking/running Diet: balanced  Wt Readings from Last 3 Encounters:  12/28/18 119 lb (54 kg)  11/30/18 121 lb (54.9 kg)  11/04/18 119 lb (54 kg)     Social History   Socioeconomic History  . Marital status: Single    Spouse name: Not on file  . Number of children: Not on file  . Years of education: Not on file  . Highest education level: Not on file  Occupational History  . Not on file  Social Needs  . Financial resource  strain: Not on file  . Food insecurity    Worry: Not on file    Inability: Not on file  . Transportation needs    Medical: Not on file    Non-medical: Not on file  Tobacco Use  . Smoking status: Never Smoker  . Smokeless tobacco: Never Used  Substance and Sexual Activity  . Alcohol use: No  . Drug use: Never  . Sexual activity: Not on file  Lifestyle  . Physical activity    Days per week: Not on file    Minutes per session: Not on file  . Stress: Not on file  Relationships  . Social Musicianconnections    Talks on phone: Not on file    Gets together: Not on file    Attends religious service: Not on file    Active member of club or organization: Not on file    Attends meetings of clubs or organizations: Not on file    Relationship status: Not on file  . Intimate partner violence    Fear of current or ex partner: Not on file    Emotionally abused: Not on file    Physically abused: Not on file    Forced sexual activity: Not on file  Other Topics Concern  . Not on file  Social History Narrative  . Not on file  Social History   Substance and Sexual Activity  Alcohol Use No   Social History   Tobacco Use  Smoking Status Never Smoker  Smokeless Tobacco Never Used   Social History   Substance and Sexual Activity  Drug Use Never    GYN: Sexual Health Menstrual status: regular menses LMP: Patient's last menstrual period was 12/07/2018. Last pap smear: see HM section History of abnormal pap smears:  Sexually active: with female partner  Health Maintenance: See under health Maintenance activity for review of completion dates as well. Immunization History  Administered Date(s) Administered  . Influenza-Unspecified 10/26/2016  . Tdap 12/28/2018      Depression Screen-PHQ2/9 Depression screen [facility] 2/9 12/28/2018 12/28/2018 11/04/2018 07/05/2018 04/26/2017  Decreased Interest 0 0 0 0 0  Down, Depressed, Hopeless 0 0 0 0 0  PHQ - 2 Score 0 0 0 0 0       Depression  Severity and Treatment Recommendations:  0-4= None  5-9= Mild / Treatment: Support, educate to call if worse; return in one month  10-14= Moderate / Treatment: Support, watchful waiting; Antidepressant or Psycotherapy  15-19= Moderately severe / Treatment: Antidepressant OR Psychotherapy  >= 20 = Major depression, severe / Antidepressant AND Psychotherapy    Review of Systems   ROS  See HPI for ROS as well.   Review of Systems  Constitutional: Negative for activity change, appetite change, chills and fever.  HENT: Negative for congestion, nosebleeds, trouble swallowing and voice change.   Respiratory: Negative for cough, shortness of breath and wheezing.   Gastrointestinal: Negative for diarrhea, nausea and vomiting.  Genitourinary: Negative for difficulty urinating, dysuria, flank pain and hematuria.  Musculoskeletal: Negative for back pain, joint swelling and neck pain.  Neurological: Negative for dizziness, speech difficulty, light-headedness and numbness.  See HPI. All other review of systems negative.   Objective:   Vitals:   12/28/18 1139  BP: 103/63  Pulse: 72  Temp: 97.6 F (36.4 C)  SpO2: 98%  Weight: 119 lb (54 kg)  Height: 5' 1.5" (1.562 m)    Body mass index is 22.12 kg/m.  Physical Exam  Physical Exam  Constitutional: Oriented to person, place, and time. Appears well-developed and well-nourished.  HENT:  Head: Normocephalic and atraumatic.  Eyes: Conjunctivae and EOM are normal.  Neck: supple, no thyromegaly Cardiovascular: Normal rate, regular rhythm, normal heart sounds and intact distal pulses.  No murmur heard. Pulmonary/Chest: Effort normal and breath sounds normal. No stridor. No respiratory distress. Has no wheezes.  Abdomen: non-distended,normoactive bs, soft, non-tender Neurological: Is alert and oriented to person, place, and time.  Skin: Skin is warm. Capillary refill takes less than 2 seconds.  Psychiatric: Has a normal mood and affect.  Behavior is normal. Judgment and thought content normal.     Assessment/Plan:   Patient was seen for a health maintenance exam.  Counseled the patient on health maintenance issues. Reviewed her health mainteance schedule and ordered appropriate tests (see orders.) Counseled on regular exercise and weight management. Recommend regular eye exams and dental cleaning.   The following issues were addressed today for health maintenance:   Lonnette was seen today for annual exam.  Diagnoses and all orders for this visit:  Encounter for health maintenance examination in adult - Women's Health Maintenance Plan Advised monthly breast exam and annual mammogram Advised dental exam every six months Discussed stress management Discussed pap smear screening guidelines    Need for Tdap vaccination -     Tdap vaccine greater than  or equal to 7yo IM  Screening-pulmonary TB -     QuantiFERON-TB Gold Plus    No follow-ups on file.    Body mass index is 22.12 kg/m.:  Discussed the patient's BMI with patient. The BMI body mass index is 22.12 kg/m.     No future appointments.  Patient Instructions     For stress management and counseling check out [facility] for a therapist near you.   If you have lab work done today you will be contacted with your lab results within the next 2 weeks.  If you have not heard from [place] then please contact us. The fastest way to get your results is to register for My Chart.   IF you received an x-ray today, you will receive an invoice from [facility]. Please contact [facility] at [phone] with questions or concerns regarding your invoice.   IF you received labwork today, you will receive an invoice from [place]. Please contact [facility] at [phone] with questions or concerns regarding your invoice.   Our billing staff will not be able to assist you with questions regarding bills from these companies.  You will be  contacted with the lab results as soon as they are available. The fastest way to get your results is to activate your My Chart account. Instructions are located on the last page of this paperwork. If you have not heard from [place] regarding the results in 2 weeks, please contact this office.

## 2018-12-30 LAB — QUANTIFERON-TB GOLD PLUS
QuantiFERON Mitogen Value: 5.62 IU/mL
QuantiFERON Nil Value: 0.02 IU/mL
QuantiFERON TB1 Ag Value: 0.02 IU/mL
QuantiFERON TB2 Ag Value: 0.02 IU/mL
QuantiFERON-TB Gold Plus: NEGATIVE

## 2019-01-02 ENCOUNTER — Telehealth: Payer: Self-pay | Admitting: Family Medicine

## 2019-01-02 NOTE — Telephone Encounter (Signed)
Copied from [place] [phone]. Topic: General - Other >> Jan 02, 2019  1:46 PM Celene Kras wrote: Reason for CRM: Pt called and is requesting to have the results for her recent blood work. Please advise.

## 2019-01-03 NOTE — Telephone Encounter (Signed)
Pt requesting quantiferon tb result, they have not be released.  Please advise.

## 2019-01-05 ENCOUNTER — Encounter: Payer: Self-pay | Admitting: Family Medicine

## 2019-01-05 NOTE — Telephone Encounter (Signed)
Sent result to mychart in a letter form.

## 2019-01-17 ENCOUNTER — Other Ambulatory Visit: Payer: Self-pay

## 2019-02-06 ENCOUNTER — Ambulatory Visit (HOSPITAL_COMMUNITY)
Admission: EM | Admit: 2019-02-06 | Discharge: 2019-02-06 | Disposition: A | Discharge status: Home / Self Care | Payer: Managed Care, Other (non HMO) | Attending: Family Medicine | Admitting: Family Medicine

## 2019-02-06 ENCOUNTER — Ambulatory Visit (INDEPENDENT_AMBULATORY_CARE_PROVIDER_SITE_OTHER): Payer: Managed Care, Other (non HMO)

## 2019-02-06 ENCOUNTER — Other Ambulatory Visit: Payer: Self-pay

## 2019-02-06 ENCOUNTER — Encounter (HOSPITAL_COMMUNITY): Payer: Self-pay

## 2019-02-06 DIAGNOSIS — M25422 Effusion, left elbow: Secondary | ICD-10-CM

## 2019-02-06 DIAGNOSIS — M25522 Pain in left elbow: Secondary | ICD-10-CM

## 2019-02-06 DIAGNOSIS — S300XXA Contusion of lower back and pelvis, initial encounter: Secondary | ICD-10-CM

## 2019-02-06 DIAGNOSIS — M25512 Pain in left shoulder: Secondary | ICD-10-CM | POA: Diagnosis not present

## 2019-02-06 DIAGNOSIS — S46912A Strain of unspecified muscle, fascia and tendon at shoulder and upper arm level, left arm, initial encounter: Secondary | ICD-10-CM

## 2019-02-06 MED ORDER — PREDNISONE 20 MG PO TABS: ORAL_TABLET | ORAL | 0 refills | Status: DC

## 2019-02-06 NOTE — Discharge Instructions (Signed)
Strategies to prevent and/or treat COVID-19:  Vitamin D3 5000 IU (125 mcg) daily Vitamin C 500 mg twice daily Zinc 50 to 75 mg daily    Listerine type mouthwash 4 times a day  COVID-19 infusion hotline:  [phone]  
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
## 2019-02-08 ENCOUNTER — Ambulatory Visit (INDEPENDENT_AMBULATORY_CARE_PROVIDER_SITE_OTHER): Payer: Managed Care, Other (non HMO) | Admitting: Registered Nurse

## 2019-02-08 ENCOUNTER — Other Ambulatory Visit: Payer: Self-pay

## 2019-02-08 DIAGNOSIS — M542 Cervicalgia: Secondary | ICD-10-CM

## 2019-02-08 DIAGNOSIS — M25512 Pain in left shoulder: Secondary | ICD-10-CM | POA: Diagnosis not present

## 2019-02-08 NOTE — Patient Instructions (Signed)
° ° ° °  If you have lab work done today you will be contacted with your lab results within the next 2 weeks.  If you have not heard from us then please contact us. The fastest way to get your results is to register for My Chart. ° ° °IF you received an x-ray today, you will receive an invoice from [facility]. Please contact [facility] at [phone] with questions or concerns regarding your invoice.  ° °IF you received labwork today, you will receive an invoice from [facility]. Please contact [facility] at [phone] with questions or concerns regarding your invoice.  ° °Our billing staff will not be able to assist you with questions regarding bills from these companies. ° °You will be contacted with the lab results as soon as they are available. The fastest way to get your results is to activate your My Chart account. Instructions are located on the last page of this paperwork. If you have not heard from us regarding the results in 2 weeks, please contact this office. °  ° ° ° °

## 2019-02-09 ENCOUNTER — Telehealth: Payer: Self-pay | Admitting: Registered Nurse

## 2019-02-09 NOTE — Telephone Encounter (Signed)
Patient called back to say//funny thing this [facility] has no pharmacy so could you send to [facility]  [address] .[place] Burg[place] [address]

## 2019-02-09 NOTE — Telephone Encounter (Signed)
Pt had an appt with Melissa Burgess on 1/13 and pharmacy did not receive the prescriptions that was discussed during the visit. The pt is currently in [place] and wants the prescriptions sent to [facility] at [phone], [place], [place] [address] Please advise

## 2019-02-10 ENCOUNTER — Other Ambulatory Visit: Payer: Self-pay | Admitting: Registered Nurse

## 2019-02-10 MED ORDER — MELOXICAM 7.5 MG PO TABS: 7.5000 mg | ORAL_TABLET | Freq: Every day | ORAL | 0 refills | Status: DC

## 2019-02-10 MED ORDER — CYCLOBENZAPRINE HCL 5 MG PO TABS: 5.0000 mg | ORAL_TABLET | Freq: Three times a day (TID) | ORAL | 1 refills | Status: DC | PRN

## 2019-02-10 NOTE — Telephone Encounter (Signed)
Rx sent  Rich Adilson Grafton, NP

## 2019-02-11 ENCOUNTER — Encounter: Payer: Self-pay | Admitting: Registered Nurse

## 2019-02-13 ENCOUNTER — Encounter: Payer: Self-pay | Admitting: Registered Nurse
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
## 2019-02-17 ENCOUNTER — Encounter: Payer: Self-pay | Admitting: Physical Therapy

## 2019-02-17 ENCOUNTER — Other Ambulatory Visit: Payer: Self-pay

## 2019-02-17 ENCOUNTER — Ambulatory Visit: Payer: Managed Care, Other (non HMO) | Attending: Registered Nurse | Admitting: Physical Therapy

## 2019-02-17 DIAGNOSIS — M62830 Muscle spasm of back: Secondary | ICD-10-CM | POA: Insufficient documentation

## 2019-02-17 DIAGNOSIS — M542 Cervicalgia: Secondary | ICD-10-CM | POA: Diagnosis not present

## 2019-02-17 DIAGNOSIS — M546 Pain in thoracic spine: Secondary | ICD-10-CM | POA: Diagnosis present
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
## 2019-02-21 ENCOUNTER — Ambulatory Visit: Payer: Managed Care, Other (non HMO) | Attending: Family Medicine

## 2019-02-21 ENCOUNTER — Other Ambulatory Visit: Payer: Self-pay

## 2019-02-21 DIAGNOSIS — M62830 Muscle spasm of back: Secondary | ICD-10-CM | POA: Insufficient documentation

## 2019-02-21 DIAGNOSIS — M542 Cervicalgia: Secondary | ICD-10-CM | POA: Insufficient documentation

## 2019-02-21 DIAGNOSIS — M546 Pain in thoracic spine: Secondary | ICD-10-CM | POA: Diagnosis present
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
## 2019-02-21 NOTE — Patient Instructions (Signed)
Band ER , hor abduct, extension x 10-20 daily  With cervical retraction

## 2019-02-27 ENCOUNTER — Telehealth: Payer: Self-pay | Admitting: Physical Therapy

## 2019-02-27 NOTE — Telephone Encounter (Signed)
She reported she forgot the appointment and would be here 03/03/19 at 7:15.

## 2019-02-28 ENCOUNTER — Ambulatory Visit: Payer: Managed Care, Other (non HMO) | Attending: Registered Nurse

## 2019-02-28 DIAGNOSIS — M546 Pain in thoracic spine: Secondary | ICD-10-CM | POA: Insufficient documentation

## 2019-02-28 DIAGNOSIS — M62830 Muscle spasm of back: Secondary | ICD-10-CM | POA: Insufficient documentation

## 2019-02-28 DIAGNOSIS — M542 Cervicalgia: Secondary | ICD-10-CM | POA: Insufficient documentation

## 2019-03-03 ENCOUNTER — Other Ambulatory Visit: Payer: Self-pay

## 2019-03-03 ENCOUNTER — Encounter: Payer: Self-pay | Admitting: Physical Therapy

## 2019-03-03 ENCOUNTER — Ambulatory Visit: Payer: Managed Care, Other (non HMO) | Admitting: Physical Therapy

## 2019-03-03 DIAGNOSIS — M546 Pain in thoracic spine: Secondary | ICD-10-CM

## 2019-03-03 DIAGNOSIS — M62830 Muscle spasm of back: Secondary | ICD-10-CM

## 2019-03-03 DIAGNOSIS — M542 Cervicalgia: Secondary | ICD-10-CM | POA: Diagnosis not present
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
## 2019-03-08 ENCOUNTER — Encounter: Payer: Self-pay | Admitting: Physical Therapy

## 2019-03-08 ENCOUNTER — Ambulatory Visit: Payer: Managed Care, Other (non HMO) | Admitting: Physical Therapy

## 2019-03-08 ENCOUNTER — Other Ambulatory Visit: Payer: Self-pay

## 2019-03-08 DIAGNOSIS — M546 Pain in thoracic spine: Secondary | ICD-10-CM

## 2019-03-08 DIAGNOSIS — M62830 Muscle spasm of back: Secondary | ICD-10-CM

## 2019-03-08 DIAGNOSIS — M542 Cervicalgia: Secondary | ICD-10-CM | POA: Diagnosis not present
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
## 2019-03-10 ENCOUNTER — Ambulatory Visit: Payer: Managed Care, Other (non HMO) | Admitting: Physical Therapy

## 2019-03-13 ENCOUNTER — Ambulatory Visit: Payer: Managed Care, Other (non HMO) | Admitting: Physical Therapy

## 2019-03-17 ENCOUNTER — Ambulatory Visit: Payer: Managed Care, Other (non HMO) | Admitting: Physical Therapy

## 2019-03-20 ENCOUNTER — Ambulatory Visit: Payer: Managed Care, Other (non HMO) | Admitting: Physical Therapy

## 2019-03-24 ENCOUNTER — Other Ambulatory Visit: Payer: Self-pay

## 2019-03-24 ENCOUNTER — Encounter: Payer: Self-pay | Admitting: Physical Therapy

## 2019-03-24 ENCOUNTER — Ambulatory Visit: Payer: Managed Care, Other (non HMO) | Admitting: Physical Therapy

## 2019-03-24 DIAGNOSIS — M62830 Muscle spasm of back: Secondary | ICD-10-CM

## 2019-03-24 DIAGNOSIS — M546 Pain in thoracic spine: Secondary | ICD-10-CM

## 2019-03-24 DIAGNOSIS — M542 Cervicalgia: Secondary | ICD-10-CM | POA: Diagnosis not present
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
## 2019-03-27 ENCOUNTER — Ambulatory Visit: Payer: Managed Care, Other (non HMO) | Admitting: Family Medicine

## 2019-04-07 ENCOUNTER — Ambulatory Visit: Payer: Managed Care, Other (non HMO) | Attending: Registered Nurse | Admitting: Physical Therapy

## 2019-04-07 ENCOUNTER — Other Ambulatory Visit: Payer: Self-pay

## 2019-04-07 ENCOUNTER — Encounter: Payer: Self-pay | Admitting: Physical Therapy

## 2019-04-07 DIAGNOSIS — M62830 Muscle spasm of back: Secondary | ICD-10-CM | POA: Diagnosis present

## 2019-04-07 DIAGNOSIS — M542 Cervicalgia: Secondary | ICD-10-CM | POA: Diagnosis present

## 2019-04-07 DIAGNOSIS — M546 Pain in thoracic spine: Secondary | ICD-10-CM | POA: Insufficient documentation
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
## 2019-04-10 ENCOUNTER — Ambulatory Visit: Payer: Managed Care, Other (non HMO) | Admitting: Physical Therapy

## 2019-05-19 ENCOUNTER — Encounter: Payer: Managed Care, Other (non HMO) | Admitting: Family Medicine

## 2019-05-25 ENCOUNTER — Encounter: Payer: Self-pay | Admitting: Family Medicine

## 2019-10-06 ENCOUNTER — Ambulatory Visit (INDEPENDENT_AMBULATORY_CARE_PROVIDER_SITE_OTHER): Payer: Managed Care, Other (non HMO) | Admitting: Registered Nurse

## 2019-10-06 ENCOUNTER — Other Ambulatory Visit: Payer: Self-pay

## 2019-10-06 ENCOUNTER — Encounter: Payer: Self-pay | Admitting: Registered Nurse

## 2019-10-06 VITALS — BP 105/71 | HR 90 | Temp 98.0°F | Ht 62.0 in | Wt 126.0 lb

## 2019-10-06 DIAGNOSIS — R350 Frequency of micturition: Secondary | ICD-10-CM | POA: Diagnosis not present

## 2019-10-06 DIAGNOSIS — R109 Unspecified abdominal pain: Secondary | ICD-10-CM | POA: Diagnosis not present

## 2019-10-06 NOTE — Patient Instructions (Signed)
° ° ° °  If you have lab work done today you will be contacted with your lab results within the next 2 weeks.  If you have not heard from us then please contact us. The fastest way to get your results is to register for My Chart. ° ° °IF you received an x-ray today, you will receive an invoice from [facility]. Please contact [facility] at [phone] with questions or concerns regarding your invoice.  ° °IF you received labwork today, you will receive an invoice from [facility]. Please contact [facility] at [phone] with questions or concerns regarding your invoice.  ° °Our billing staff will not be able to assist you with questions regarding bills from these companies. ° °You will be contacted with the lab results as soon as they are available. The fastest way to get your results is to activate your My Chart account. Instructions are located on the last page of this paperwork. If you have not heard from us regarding the results in 2 weeks, please contact this office. °  ° ° ° °

## 2019-10-06 NOTE — Progress Notes (Signed)
Established Patient Office Visit  Subjective:  Patient ID: Melissa Burgess, female    DOB: 03-12-1995  Age: 24 y.o. MRN: [id number]  CC:  Chief Complaint  Patient presents with  . feels as if kidney is overworking    since recovered from covid mid Aug to Aug 30    HPI Melissa Burgess presents for COVID follow up  Feels as though kidneys are functioning poorly. Having flank pain. occ - worse some days than others. No dysuria, no hematuria, no systemic symptoms. Has been working much - trying to stay hydrated. Pain is worse on L side than R. No GI symptoms  No other concerns at this time.   Past Medical History:  Diagnosis Date  . Anemia   . Asthma     No past surgical history on file.  Family History  Problem Relation Age of Onset  . Healthy Mother   . Asthma Father   . Diabetes Maternal Grandmother   . Heart disease Maternal Grandmother   . Hyperlipidemia Maternal Grandmother   . Hypertension Maternal Grandmother   . Diabetes Paternal Grandmother   . Hypertension Paternal Grandmother     Social History   Socioeconomic History  . Marital status: Single    Spouse name: Not on file  . Number of children: Not on file  . Years of education: Not on file  . Highest education level: Not on file  Occupational History  . Not on file  Tobacco Use  . Smoking status: Never Smoker  . Smokeless tobacco: Never Used  Vaping Use  . Vaping Use: Never used  Substance and Sexual Activity  . Alcohol use: Yes    Comment: occ  . Drug use: Never  . Sexual activity: Not on file  Other Topics Concern  . Not on file  Social History Narrative  . Not on file   Social Determinants of Health   Financial Resource Strain:   . Difficulty of Paying Living Expenses: Not on file  Food Insecurity:   . Worried About Programme researcher, broadcasting/film/video in the Last Year: Not on file  . Ran Out of Food in the Last Year: Not on file  Transportation Needs:   . Lack of Transportation (Medical): Not on file    . Lack of Transportation (Non-Medical): Not on file  Physical Activity:   . Days of Exercise per Week: Not on file  . Minutes of Exercise per Session: Not on file  Stress:   . Feeling of Stress : Not on file  Social Connections:   . Frequency of Communication with Friends and Family: Not on file  . Frequency of Social Gatherings with Friends and Family: Not on file  . Attends Religious Services: Not on file  . Active Member of Clubs or Organizations: Not on file  . Attends Banker Meetings: Not on file  . Marital Status: Not on file  Intimate Partner Violence:   . Fear of Current or Ex-Partner: Not on file  . Emotionally Abused: Not on file  . Physically Abused: Not on file  . Sexually Abused: Not on file    Outpatient Medications Prior to Visit  Medication Sig Dispense Refill  . albuterol (VENTOLIN HFA) 108 (90 Base) MCG/ACT inhaler Inhale 2 puffs into the lungs every 6 (six) hours as needed for wheezing or shortness of breath. 1 Inhaler 1  . ferrous sulfate 325 (65 FE) MG EC tablet Take 1 tablet (325 mg total) by mouth 3 (three) times  daily with meals. 90 tablet 3  . cyclobenzaprine (FLEXERIL) 5 MG tablet Take 1 tablet (5 mg total) by mouth 3 (three) times daily as needed for muscle spasms. 30 tablet 1  . meloxicam (MOBIC) 7.5 MG tablet Take 1 tablet (7.5 mg total) by mouth daily. 30 tablet 0  . predniSONE (DELTASONE) 20 MG tablet Two daily with food 10 tablet 0   No facility-administered medications prior to visit.    No Known Allergies  ROS Review of Systems  Constitutional: Negative.   HENT: Negative.   Eyes: Negative.   Respiratory: Negative.   Cardiovascular: Negative.   Gastrointestinal: Negative.   Genitourinary: Positive for flank pain.  Musculoskeletal: Positive for back pain. Negative for arthralgias, gait problem, joint swelling, myalgias, neck pain and neck stiffness.  Skin: Negative.   Neurological: Negative.   Psychiatric/Behavioral:  Negative.       Objective:    Physical Exam Vitals and nursing note reviewed.  Constitutional:      General: She is not in acute distress.    Appearance: Normal appearance. She is normal weight. She is not ill-appearing, toxic-appearing or diaphoretic.  Cardiovascular:     Rate and Rhythm: Normal rate and regular rhythm.     Heart sounds: Normal heart sounds.  Pulmonary:     Effort: Pulmonary effort is normal. No respiratory distress.     Breath sounds: Normal breath sounds.  Abdominal:     General: Abdomen is flat. Bowel sounds are normal. There is no distension.     Palpations: There is no mass.     Tenderness: There is no abdominal tenderness. There is no right CVA tenderness, left CVA tenderness, guarding or rebound.     Hernia: No hernia is present.  Neurological:     General: No focal deficit present.     Mental Status: She is alert and oriented to person, place, and time. Mental status is at baseline.  Psychiatric:        Mood and Affect: Mood normal.        Behavior: Behavior normal.        Thought Content: Thought content normal.        Judgment: Judgment normal.     BP 105/71   Pulse 90   Temp 98 F (36.7 C) (Temporal)   Ht 5\' 2"  (1.575 m)   Wt 126 lb (57.2 kg)   LMP 09/29/2019   SpO2 99%   BMI 23.05 kg/m  Wt Readings from Last 3 Encounters:  10/06/19 126 lb (57.2 kg)  12/28/18 119 lb (54 kg)  11/30/18 121 lb (54.9 kg)     Health Maintenance Due  Topic Date Due  . Hepatitis C Screening  Never done  . PAP-Cervical Cytology Screening  Never done  . PAP SMEAR-Modifier  Never done  . INFLUENZA VACCINE  08/27/2019    There are no preventive care reminders to display for this patient.  Lab Results  Component Value Date   TSH 0.810 07/05/2018   Lab Results  Component Value Date   WBC 3.8 10/31/2018   HGB 11.3 10/31/2018   HCT 35.7 10/31/2018   MCV 76 (L) 10/31/2018   PLT 323 10/31/2018   Lab Results  Component Value Date   NA 142 07/05/2018    K 3.8 07/05/2018   CO2 21 07/05/2018   GLUCOSE 88 07/05/2018   BUN 8 07/05/2018   CREATININE 0.86 07/05/2018   BILITOT 0.3 07/05/2018   ALKPHOS 52 07/05/2018   AST 14 07/05/2018  ALT 9 07/05/2018   PROT 7.2 07/05/2018   ALBUMIN 4.4 07/05/2018   CALCIUM 9.6 07/05/2018   No results found for: CHOL No results found for: HDL No results found for: LDLCALC No results found for: TRIG No results found for: CHOLHDL No results found for: IAXK5V    Assessment & Plan:   Problem List Items Addressed This Visit    None    Visit Diagnoses    Frequent urination    -  Primary   Relevant Orders   CBC   Comprehensive metabolic panel   Urinalysis, Routine w reflex microscopic   Flank pain       Relevant Orders   CBC   Comprehensive metabolic panel   Urinalysis, Routine w reflex microscopic      No orders of the defined types were placed in this encounter.   Follow-up: No follow-ups on file.   PLAN  Will draw labs  Think MSK etiology is as likely as renal - but will work up as renal until proven otherwise  Given red flag symptoms and reasons to return to clinic  Patient encouraged to call clinic with any questions, comments, or concerns.  Janeece Agee, NP

## 2019-10-10 LAB — COMPREHENSIVE METABOLIC PANEL
ALT: 13 IU/L (ref 0–32)
AST: 13 IU/L (ref 0–40)
Albumin/Globulin Ratio: 1.7 (ref 1.2–2.2)
Albumin: 4.8 g/dL (ref 3.9–5.0)
Alkaline Phosphatase: 48 IU/L (ref 48–121)
BUN/Creatinine Ratio: 11 (ref 9–23)
BUN: 9 mg/dL (ref 6–20)
Bilirubin Total: 0.3 mg/dL (ref 0.0–1.2)
CO2: 22 mmol/L (ref 20–29)
Calcium: 9.4 mg/dL (ref 8.7–10.2)
Chloride: 101 mmol/L (ref 96–106)
Creatinine, Ser: 0.79 mg/dL (ref 0.57–1.00)
GFR calc Af Amer: 122 mL/min/{1.73_m2} (ref >59)
GFR calc non Af Amer: 106 mL/min/{1.73_m2} (ref >59)
Globulin, Total: 2.8 g/dL (ref 1.5–4.5)
Glucose: 74 mg/dL (ref 65–99)
Potassium: 4.2 mmol/L (ref 3.5–5.2)
Sodium: 140 mmol/L (ref 134–144)
Total Protein: 7.6 g/dL (ref 6.0–8.5)

## 2019-10-10 LAB — CBC
Hematocrit: 38.8 % (ref 34.0–46.6)
Hemoglobin: 11.5 g/dL (ref 11.1–15.9)
MCH: 23.2 pg — ABNORMAL LOW (ref 26.6–33.0)
MCHC: 29.6 g/dL — ABNORMAL LOW (ref 31.5–35.7)
MCV: 78 fL — ABNORMAL LOW (ref 79–97)
Platelets: 328 10*3/uL (ref 150–450)
RBC: 4.95 x10E6/uL (ref 3.77–5.28)
RDW: 13.1 % (ref 11.7–15.4)
WBC: 3.7 10*3/uL (ref 3.4–10.8)

## 2019-10-10 LAB — URINALYSIS, ROUTINE W REFLEX MICROSCOPIC

## 2019-10-17 ENCOUNTER — Ambulatory Visit: Payer: Managed Care, Other (non HMO) | Attending: Internal Medicine

## 2019-10-17 DIAGNOSIS — Z23 Encounter for immunization: Secondary | ICD-10-CM

## 2019-10-17 NOTE — Progress Notes (Signed)
   Covid-19 Vaccination Clinic  Name:  Melissa Burgess    MRN: [id number] DOB: 06-25-1995  10/17/2019  Melissa Burgess was observed post Covid-19 immunization for 15 minutes without incident. She was provided with Vaccine Information Sheet and instruction to access the V-Safe system.   Melissa Burgess was instructed to call 911 with any severe reactions post vaccine: Marland Kitchen Difficulty breathing  . Swelling of face and throat  . A fast heartbeat  . A bad rash all over body  . Dizziness and weakness

## 2020-01-22 ENCOUNTER — Encounter: Payer: Self-pay | Admitting: Registered Nurse

## 2020-01-22 ENCOUNTER — Other Ambulatory Visit: Payer: Self-pay

## 2020-01-22 ENCOUNTER — Telehealth (INDEPENDENT_AMBULATORY_CARE_PROVIDER_SITE_OTHER): Payer: Managed Care, Other (non HMO) | Admitting: Registered Nurse

## 2020-01-22 DIAGNOSIS — J039 Acute tonsillitis, unspecified: Secondary | ICD-10-CM | POA: Diagnosis not present

## 2020-01-22 MED ORDER — AZITHROMYCIN 250 MG PO TABS: ORAL_TABLET | ORAL | 0 refills | Status: DC
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
## 2020-04-29 ENCOUNTER — Telehealth: Payer: Managed Care, Other (non HMO) | Admitting: Physician Assistant

## 2020-04-29 ENCOUNTER — Encounter: Payer: Self-pay | Admitting: Physician Assistant

## 2020-04-29 DIAGNOSIS — J029 Acute pharyngitis, unspecified: Secondary | ICD-10-CM

## 2020-04-29 MED ORDER — AMOXICILLIN-POT CLAVULANATE 875-125 MG PO TABS: 1.0000 | ORAL_TABLET | Freq: Two times a day (BID) | ORAL | 0 refills | Status: DC

## 2020-04-29 NOTE — Addendum Note (Signed)
Addended by: Waldon Merl on: 04/29/2020 09:55 AM   Modules accepted: Level of Service

## 2020-04-29 NOTE — Progress Notes (Signed)
I have spent 5 minutes in review of e-visit questionnaire, review and updating patient chart, medical decision making and response to patient.   Gurdeep Keesey Cody Ott Zimmerle, PA-C    

## 2020-04-29 NOTE — Progress Notes (Signed)

## 2020-04-29 NOTE — Patient Instructions (Addendum)
1. Pharyngitis, unspecified etiology  - concern for possible bacterial pharyngitis  - Augmentin - complete the course as directed Pharyngitis  Pharyngitis is a sore throat (pharynx). This is when there is redness, pain, and swelling in your throat. Most of the time, this condition gets better on its own. In some cases, you may need medicine. Follow these instructions at home:  Take over-the-counter and prescription medicines only as told by your doctor. ? If you were prescribed an antibiotic medicine, take it as told by your doctor. Do not stop taking the antibiotic even if you start to feel better. ? Do not give children aspirin. Aspirin has been linked to Reye syndrome.  Drink enough water and fluids to keep your pee (urine) clear or pale yellow.  Get a lot of rest.  Rinse your mouth (gargle) with a salt-water mixture 3-4 times a day or as needed. To make a salt-water mixture, completely dissolve -1 tsp of salt in 1 cup of warm water.  If your doctor approves, you may use throat lozenges or sprays to soothe your throat. Contact a doctor if:  You have large, tender lumps in your neck.  You have a rash.  You cough up green, yellow-brown, or bloody spit. Get help right away if:  You have a stiff neck.  You drool or cannot swallow liquids.  You cannot drink or take medicines without throwing up.  You have very bad pain that does not go away with medicine.  You have problems breathing, and it is not from a stuffy nose.  You have new pain and swelling in your knees, ankles, wrists, or elbows. Summary  Pharyngitis is a sore throat (pharynx). This is when there is redness, pain, and swelling in your throat.  If you were prescribed an antibiotic medicine, take it as told by your doctor. Do not stop taking the antibiotic even if you start to feel better.  Most of the time, pharyngitis gets better on its own. Sometimes, you may need medicine. This information is not intended to  replace advice given to you by your health care provider. Make sure you discuss any questions you have with your health care provider. Document Revised: 12/25/2016 Document Reviewed: 02/18/2016 Elsevier Patient Education  2021 [employer].
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
## 2020-04-29 NOTE — Progress Notes (Signed)
Encouraged patient to submit video visit for further evaluation as she thought she was submitting a video visit. She is scheduled for video visit at 10. Will make e-visit no charge.

## 2020-05-13 ENCOUNTER — Encounter: Payer: Self-pay | Admitting: Registered Nurse

## 2020-05-13 ENCOUNTER — Other Ambulatory Visit: Payer: Self-pay | Admitting: Registered Nurse

## 2020-05-13 DIAGNOSIS — G8929 Other chronic pain: Secondary | ICD-10-CM
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
## 2020-05-28 ENCOUNTER — Encounter (HOSPITAL_BASED_OUTPATIENT_CLINIC_OR_DEPARTMENT_OTHER): Payer: Self-pay | Admitting: Physical Therapy

## 2020-05-28 ENCOUNTER — Other Ambulatory Visit: Payer: Self-pay

## 2020-05-28 ENCOUNTER — Ambulatory Visit (HOSPITAL_BASED_OUTPATIENT_CLINIC_OR_DEPARTMENT_OTHER): Payer: Managed Care, Other (non HMO) | Attending: Registered Nurse | Admitting: Physical Therapy

## 2020-05-28 DIAGNOSIS — M542 Cervicalgia: Secondary | ICD-10-CM | POA: Insufficient documentation

## 2020-05-28 DIAGNOSIS — M62838 Other muscle spasm: Secondary | ICD-10-CM | POA: Insufficient documentation

## 2020-05-28 DIAGNOSIS — R293 Abnormal posture: Secondary | ICD-10-CM | POA: Diagnosis present

## 2020-05-29 ENCOUNTER — Encounter (HOSPITAL_BASED_OUTPATIENT_CLINIC_OR_DEPARTMENT_OTHER): Payer: Self-pay | Admitting: Physical Therapy

## 2020-05-29 NOTE — Patient Instructions (Signed)
Access Code: [email or web address] URL: [email or web address] Date: 05/29/2020 Prepared by: Lorayne Bender  Exercises Gentle Levator Scapulae Stretch - 1 x daily - 7 x weekly - 3 sets - 3 reps - 20 hold Seated Mid Back Stretch - 1 x daily - 7 x weekly - 3 sets - 3 reps - 20 hold Scapular Retraction with Resistance - 1 x daily - 7 x weekly - 3 sets - 10 reps Seated Upper Trapezius Stretch - 1 x daily - 7 x weekly - 3 sets - 3 reps - 20 hold
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
## 2020-06-03 ENCOUNTER — Ambulatory Visit (HOSPITAL_BASED_OUTPATIENT_CLINIC_OR_DEPARTMENT_OTHER): Payer: Managed Care, Other (non HMO) | Attending: Registered Nurse | Admitting: Physical Therapy

## 2020-06-03 ENCOUNTER — Encounter (HOSPITAL_BASED_OUTPATIENT_CLINIC_OR_DEPARTMENT_OTHER): Payer: Self-pay | Admitting: Physical Therapy

## 2020-06-03 ENCOUNTER — Other Ambulatory Visit: Payer: Self-pay

## 2020-06-03 DIAGNOSIS — M546 Pain in thoracic spine: Secondary | ICD-10-CM | POA: Insufficient documentation

## 2020-06-03 DIAGNOSIS — R293 Abnormal posture: Secondary | ICD-10-CM | POA: Diagnosis present

## 2020-06-03 DIAGNOSIS — M62830 Muscle spasm of back: Secondary | ICD-10-CM | POA: Diagnosis present

## 2020-06-03 DIAGNOSIS — M62838 Other muscle spasm: Secondary | ICD-10-CM | POA: Insufficient documentation

## 2020-06-03 DIAGNOSIS — M542 Cervicalgia: Secondary | ICD-10-CM | POA: Diagnosis present
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
## 2020-06-14 ENCOUNTER — Ambulatory Visit (HOSPITAL_BASED_OUTPATIENT_CLINIC_OR_DEPARTMENT_OTHER): Payer: Managed Care, Other (non HMO) | Admitting: Physical Therapy

## 2020-06-18 ENCOUNTER — Ambulatory Visit (HOSPITAL_BASED_OUTPATIENT_CLINIC_OR_DEPARTMENT_OTHER): Payer: Managed Care, Other (non HMO) | Admitting: Physical Therapy

## 2020-06-18 ENCOUNTER — Other Ambulatory Visit: Payer: Self-pay

## 2020-06-18 ENCOUNTER — Encounter (HOSPITAL_BASED_OUTPATIENT_CLINIC_OR_DEPARTMENT_OTHER): Payer: Self-pay | Admitting: Physical Therapy

## 2020-06-18 DIAGNOSIS — M546 Pain in thoracic spine: Secondary | ICD-10-CM

## 2020-06-18 DIAGNOSIS — M542 Cervicalgia: Secondary | ICD-10-CM

## 2020-06-18 DIAGNOSIS — M62830 Muscle spasm of back: Secondary | ICD-10-CM

## 2020-06-18 DIAGNOSIS — M62838 Other muscle spasm: Secondary | ICD-10-CM | POA: Diagnosis not present

## 2020-06-18 DIAGNOSIS — R293 Abnormal posture: Secondary | ICD-10-CM
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
## 2020-06-26 ENCOUNTER — Encounter: Payer: Self-pay | Admitting: Registered Nurse

## 2020-06-26 ENCOUNTER — Telehealth: Payer: Managed Care, Other (non HMO) | Admitting: Physician Assistant

## 2020-06-26 DIAGNOSIS — J028 Acute pharyngitis due to other specified organisms: Secondary | ICD-10-CM | POA: Diagnosis not present

## 2020-06-26 DIAGNOSIS — B9689 Other specified bacterial agents as the cause of diseases classified elsewhere: Secondary | ICD-10-CM

## 2020-06-26 MED ORDER — AZITHROMYCIN 250 MG PO TABS: ORAL_TABLET | ORAL | 0 refills | Status: DC

## 2020-06-26 NOTE — Progress Notes (Signed)
We are sorry that you are not feeling well.  Here is how we plan to help!  Based on what you have shared with me it is likely that you have Bacterial pharyngitis.  Bacterial pharyngitis is inflammation and infection in the back of the throat.  This is an infection cause by bacteria and is treated with antibiotics.  I have prescribed Azithromycin 250 mg two tablets today and then one daily for 4 additional days. For throat pain, we recommend over the counter oral pain relief medications such as acetaminophen or aspirin, or anti-inflammatory medications such as ibuprofen or naproxen sodium. Topical treatments such as oral throat lozenges or sprays may be used as needed. Strep infections are not as easily transmitted as other respiratory infections, however we still recommend that you avoid close contact with loved ones, especially the very young and elderly.  Remember to wash your hands thoroughly throughout the day as this is the number one way to prevent the spread of infection and wipe down door knobs and counters with disinfectant.   Home Care:  Only take medications as instructed by your medical team.  Complete the entire course of an antibiotic.  Do not take these medications with alcohol.  A steam or ultrasonic humidifier can help congestion.  You can place a towel over your head and breathe in the steam from hot water coming from a faucet.  Avoid close contacts especially the very young and the elderly.  Cover your mouth when you cough or sneeze.  Always remember to wash your hands.  Get Help Right Away If:  You develop worsening fever or sinus pain.  You develop a severe head ache or visual changes.  Your symptoms persist after you have completed your treatment plan.  Make sure you  Understand these instructions.  Will watch your condition.  Will get help right away if you are not doing well or get worse.  Your e-visit answers were reviewed by a board certified advanced  clinical practitioner to complete your personal care plan.  Depending on the condition, your plan could have included both over the counter or prescription medications.  If there is a problem please reply  once you have received a response from your provider.  Your safety is important to [place].  If you have drug allergies check your prescription carefully.    You can use MyChart to ask questions about today's visit, request a non-urgent call back, or ask for a work or school excuse for 24 hours related to this e-Visit. If it has been greater than 24 hours you will need to follow up with your provider, or enter a new e-Visit to address those concerns.  You will get an e-mail in the next two days asking about your experience.  I hope that your e-visit has been valuable and will speed your recovery. Thank you for using e-visits.  I provided 5 minutes of non face-to-face time during this encounter for chart review and documentation.

## 2020-06-26 NOTE — Addendum Note (Signed)
Addended by: Margaretann Loveless on: 06/26/2020 02:50 PM   Modules accepted: Orders

## 2020-06-28 ENCOUNTER — Ambulatory Visit (HOSPITAL_BASED_OUTPATIENT_CLINIC_OR_DEPARTMENT_OTHER): Payer: Managed Care, Other (non HMO) | Attending: Registered Nurse | Admitting: Physical Therapy

## 2020-06-28 DIAGNOSIS — M62830 Muscle spasm of back: Secondary | ICD-10-CM | POA: Insufficient documentation

## 2020-06-28 DIAGNOSIS — R293 Abnormal posture: Secondary | ICD-10-CM | POA: Insufficient documentation

## 2020-06-28 DIAGNOSIS — M542 Cervicalgia: Secondary | ICD-10-CM | POA: Insufficient documentation

## 2020-06-28 DIAGNOSIS — M546 Pain in thoracic spine: Secondary | ICD-10-CM | POA: Insufficient documentation

## 2020-06-28 DIAGNOSIS — M62838 Other muscle spasm: Secondary | ICD-10-CM | POA: Insufficient documentation

## 2020-07-16 ENCOUNTER — Telehealth: Payer: Managed Care, Other (non HMO) | Admitting: Physician Assistant

## 2020-07-16 DIAGNOSIS — R0982 Postnasal drip: Secondary | ICD-10-CM

## 2020-07-16 DIAGNOSIS — Z9109 Other allergy status, other than to drugs and biological substances: Secondary | ICD-10-CM

## 2020-07-16 MED ORDER — FLUTICASONE PROPIONATE 50 MCG/ACT NA SUSP: 2.0000 | Freq: Every day | NASAL | 0 refills | Status: AC

## 2020-07-16 NOTE — Progress Notes (Signed)
  E-Visit for Sore Throat  We are sorry that you are not feeling well.  Here is how we plan to help! Your symptoms are most likely related to inflammation for mild environmental allergies, causing nighttime drainage when you are lying down and contributing to sore throat. This does not seem viral or bacterial thankfully. Keep well-hydrated and make sure to run a humidifier in the bedroom at night. I recommend a saline nasal rinse twice daily. I am sending in a nasal steroid for you to use in the evening along with an OTC antihistamine like Claritin or Zyrtec. This should calm things down and allow you to wake up without a sore throat.   Home Care: Only take medications as instructed by your medical team. Do not drink alcohol while taking these medications. A steam or ultrasonic humidifier can help congestion.  You can place a towel over your head and breathe in the steam from hot water coming from a faucet. Avoid close contacts especially the very young and the elderly. Cover your mouth when you cough or sneeze. Always remember to wash your hands.  Get Help Right Away If: You develop worsening fever or throat pain. You develop a severe head ache or visual changes. Your symptoms persist after you have completed your treatment plan.  Make sure you Understand these instructions. Will watch your condition. Will get help right away if you are not doing well or get worse.  Your e-visit answers were reviewed by a board certified advanced clinical practitioner to complete your personal care plan.  Depending on the condition, your plan could have included both over the counter or prescription medications.  If there is a problem please reply  once you have received a response from your provider.  Your safety is important to [place].  If you have drug allergies check your prescription carefully.    You can use MyChart to ask questions about todays visit, request a non-urgent call back, or ask for a work  or school excuse for 24 hours related to this e-Visit. If it has been greater than 24 hours you will need to follow up with your provider, or enter a new e-Visit to address those concerns.  You will get an e-mail in the next two days asking about your experience.  I hope that your e-visit has been valuable and will speed your recovery. Thank you for using e-visits.

## 2020-07-16 NOTE — Progress Notes (Signed)
I have spent 5 minutes in review of e-visit questionnaire, review and updating patient chart, medical decision making and response to patient.   Chasin Findling Cody Israel Wunder, PA-C    

## 2021-01-23 ENCOUNTER — Encounter (HOSPITAL_BASED_OUTPATIENT_CLINIC_OR_DEPARTMENT_OTHER): Payer: Self-pay | Admitting: Physical Therapy
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
## 2021-04-27 ENCOUNTER — Telehealth: Payer: Self-pay | Admitting: Family

## 2021-04-27 DIAGNOSIS — U071 COVID-19: Secondary | ICD-10-CM

## 2021-04-27 NOTE — Progress Notes (Signed)
?Virtual Visit Consent  ? ?Keene Breath, you are scheduled for a virtual visit with a [facility] provider today.   ?  ?Just as with appointments in the office, your consent must be obtained to participate.  Your consent will be active for this visit and any virtual visit you may have with one of our providers in the next 365 days.   ?  ?If you have a MyChart account, a copy of this consent can be sent to you electronically.  All virtual visits are billed to your insurance company just like a traditional visit in the office.   ? ?As this is a virtual visit, video technology does not allow for your provider to perform a traditional examination.  This may limit your provider's ability to fully assess your condition.  If your provider identifies any concerns that need to be evaluated in person or the need to arrange testing (such as labs, EKG, etc.), we will make arrangements to do so.   ?  ?Although advances in technology are sophisticated, we cannot ensure that it will always work on either your end or our end.  If the connection with a video visit is poor, the visit may have to be switched to a telephone visit.  With either a video or telephone visit, we are not always able to ensure that we have a secure connection.    ? ?I need to obtain your verbal consent now.   Are you willing to proceed with your visit today?  ?  ?Elonda Giuliano has provided verbal consent on 04/27/2021 for a virtual visit (video or telephone). ?  ?Jannifer Rodney, FNP  ? ?Date: 04/27/2021 7:51 PM ? ? ?Virtual Visit via Video Note  ? Edwyna Perfect, connected with  Jennamarie Goings  ([id number], [birth year]) on 04/27/21 at  7:45 PM EDT by a video-enabled telemedicine application and verified that I am speaking with the correct person using two identifiers. ? ?Location: ?Patient: Virtual Visit Location Patient: Home ?Provider: Virtual Visit Location Provider: Home Office ?  ?I discussed the limitations of evaluation and management by telemedicine and the  availability of in person appointments. The patient expressed understanding and agreed to proceed.   ? ?History of Present Illness: ?Melissa Burgess is a 26 y.o. who identifies as a female who was assigned female at birth, and is being seen today for cough. States symptoms started Friday and tested positive this morning.  ? ?HPI: Cough ?This is a new problem. The current episode started in the past 7 days. The problem has been unchanged. The problem occurs every few minutes. The cough is Productive of sputum. Associated symptoms include chills, a fever, headaches, myalgias, nasal congestion and postnasal drip. Pertinent negatives include no ear congestion, ear pain, sore throat, shortness of breath or wheezing. She has tried rest and OTC cough suppressant for the symptoms.   ?Problems:  ?Patient Active Problem List  ? Diagnosis Date Noted  ? Asthma 1995-04-07  ?  ?Allergies: No Known Allergies ?Medications:  ?Current Outpatient Medications:  ?  albuterol (VENTOLIN HFA) 108 (90 Base) MCG/ACT inhaler, Inhale 2 puffs into the lungs every 6 (six) hours as needed for wheezing or shortness of breath., Disp: 1 Inhaler, Rfl: 1 ?  ferrous sulfate 325 (65 FE) MG EC tablet, Take 1 tablet (325 mg total) by mouth 3 (three) times daily with meals., Disp: 90 tablet, Rfl: 3 ?  fluticasone (FLONASE) 50 MCG/ACT nasal spray, Place 2 sprays into both nostrils daily., Disp: 16 g,  Rfl: 0 ? ?Observations/Objective: ?Patient is well-developed, well-nourished in no acute distress.  ?Resting comfortably  at home.  ?Head is normocephalic, atraumatic.  ?No labored breathing.  ?Speech is clear and coherent with logical content.  ?Patient is alert and oriented at baseline.  ? ? ?Assessment and Plan: ?1. COVID-19 ? ?COVID positive, rest, force fluids, tylenol as needed, Quarantine for at least 5 days and you are fever free, then must wear a mask out in public from day 6-10, report any worsening symptoms such as increased shortness of breath,  swelling, or continued high fevers. Given age and current symptoms, pt will hold off on antivirals at this time.  ? ? ?Follow Up Instructions: ?I discussed the assessment and treatment plan with the patient. The patient was provided an opportunity to ask questions and all were answered. The patient agreed with the plan and demonstrated an understanding of the instructions.  A copy of instructions were sent to the patient via MyChart unless otherwise noted below.  ? ? ?The patient was advised to call back or seek an in-person evaluation if the symptoms worsen or if the condition fails to improve as anticipated. ? ?Time:  ?I spent 11 minutes with the patient via telehealth technology discussing the above problems/concerns.   ? ?Jannifer Rodney, FNP ? ?
